# Patient Record
Sex: Female | Born: 1975 | Race: Black or African American | Hispanic: No | Marital: Married | Smoking: Never smoker
Health system: Southern US, Community
[De-identification: ages and names within clinical notes are randomized; demographics above are authoritative.]

## PROBLEM LIST (undated history)

## (undated) DIAGNOSIS — G43909 Migraine, unspecified, not intractable, without status migrainosus: Secondary | ICD-10-CM

## (undated) DIAGNOSIS — M779 Enthesopathy, unspecified: Secondary | ICD-10-CM

## (undated) DIAGNOSIS — R112 Nausea with vomiting, unspecified: Secondary | ICD-10-CM

## (undated) DIAGNOSIS — Z9889 Other specified postprocedural states: Secondary | ICD-10-CM

---

## 1999-02-07 ENCOUNTER — Other Ambulatory Visit: Admission: RE | Admit: 1999-02-07 | Discharge: 1999-02-07 | Payer: Self-pay | Admitting: Obstetrics and Gynecology

## 1999-05-09 ENCOUNTER — Other Ambulatory Visit: Admission: RE | Admit: 1999-05-09 | Discharge: 1999-05-09 | Payer: Self-pay | Admitting: Obstetrics and Gynecology

## 1999-08-13 ENCOUNTER — Inpatient Hospital Stay (HOSPITAL_COMMUNITY): Admission: AD | Admit: 1999-08-13 | Discharge: 1999-08-15 | Payer: Self-pay | Admitting: Obstetrics and Gynecology

## 1999-12-04 ENCOUNTER — Encounter (INDEPENDENT_AMBULATORY_CARE_PROVIDER_SITE_OTHER): Payer: Self-pay

## 1999-12-04 ENCOUNTER — Other Ambulatory Visit: Admission: RE | Admit: 1999-12-04 | Discharge: 1999-12-04 | Payer: Self-pay | Admitting: Obstetrics and Gynecology

## 2003-10-08 ENCOUNTER — Other Ambulatory Visit: Admission: RE | Admit: 2003-10-08 | Discharge: 2003-10-08 | Payer: Self-pay | Admitting: *Deleted

## 2005-01-01 ENCOUNTER — Other Ambulatory Visit: Admission: RE | Admit: 2005-01-01 | Discharge: 2005-01-01 | Payer: Self-pay | Admitting: Obstetrics and Gynecology

## 2006-02-26 ENCOUNTER — Other Ambulatory Visit: Admission: RE | Admit: 2006-02-26 | Discharge: 2006-02-26 | Payer: Self-pay | Admitting: Obstetrics & Gynecology

## 2006-11-13 ENCOUNTER — Inpatient Hospital Stay (HOSPITAL_COMMUNITY): Admission: AD | Admit: 2006-11-13 | Discharge: 2006-11-13 | Payer: Self-pay | Admitting: Obstetrics and Gynecology

## 2007-05-28 ENCOUNTER — Inpatient Hospital Stay (HOSPITAL_COMMUNITY): Admission: AD | Admit: 2007-05-28 | Discharge: 2007-05-28 | Payer: Self-pay | Admitting: Obstetrics and Gynecology

## 2007-05-30 ENCOUNTER — Inpatient Hospital Stay (HOSPITAL_COMMUNITY): Admission: AD | Admit: 2007-05-30 | Discharge: 2007-06-01 | Payer: Self-pay | Admitting: Obstetrics and Gynecology

## 2008-06-28 ENCOUNTER — Emergency Department (HOSPITAL_COMMUNITY): Admission: EM | Admit: 2008-06-28 | Discharge: 2008-06-28 | Payer: Self-pay | Admitting: Emergency Medicine

## 2010-07-01 NOTE — H&P (Signed)
NAME:  Shannon Dean, Shannon Dean                 ACCOUNT NO.:  [id number]   MEDICAL RECORD NO.:  [id number]          PATIENT TYPE:  MAT   LOCATION:  [facility]                          FACILITY:  [facility]   PHYSICIAN:  Hal Morales, M.D.DATE OF BIRTH:  [birth year]   DATE OF ADMISSION:  05/30/2007  DATE OF DISCHARGE:                              HISTORY & PHYSICAL   This is a 35 year old gravida 4, para 1-1-1-2 at 39-4/7 weeks who  presents in labor.  She denies rupture of membranes or bleeding and  reports positive fetal movement.  Pregnancy has been followed by the  nurse midwife service and remarkable for  1. First trimester bleed.  2. Placenta previa, resolved.  3. First trimester BV  4. History of rapid labor,  5. Five abnormal Pap.  6. History of 36-week delivery.  7. Group B strep negative.   ALLERGIES:  None.   OBSTETRICAL HISTORY:  Remarkable for  1. Spontaneous abortion in 1999.  2. Vaginal delivery in 2001 of a female infant at [redacted] weeks gestation      weighing 5 pounds 9 ounces.  3. She had a vaginal delivery in 2004 of a female infant at [redacted] weeks      gestation weighing 7 pounds 4 ounces.   MEDICAL HISTORY:  Remarkable for abnormal Pap in 2001 with colposcopy  and normal since then.   SURGICAL HISTORY:  Negative.   FAMILY HISTORY:  Remarkable for grandfather and grandmother with heart  disease, hypertension and diabetes.  Grandfather with stroke.   GENETIC HISTORY:  Remarkable for father of baby's brother with extra  digits.  The patient's cousin with sickle cell trait and father of the  baby's grandmother with twins.   SOCIAL HISTORY:  The patient is married to [employer], who is involved  and supportive.  She is of the non-denominational Christian faith.  She  denies any alcohol, tobacco or drug use.   PERTINENT LABORATORY DATA:  Hemoglobin 12.4, platelets 172.  Blood type  O+, antibody screen negative, RPR nonreactive.  Hepatitis negative, HIV  negative.  Pap test  normal.   HISTORY OF CURRENT PREGNANCY:  The patient entered care at 12 weeks.  She had an ultrasound that was normal at that time, except for a  placenta previa.  She had an anatomy ultrasound at 18 weeks that showed  placenta now low-lying and a Glucola was done at 27 weeks and was  normal.  Blood type was O+ and Group B strep was negative at term.   OBJECTIVE:  VITAL SIGNS: Stable, afebrile.  HEENT: Within normal limits.  NECK:  Thyroid normal, not enlarged.  CHEST: Clear to auscultation.  HEART:  Regular rate and rhythm.  ABDOMEN:  Gravid, vertex Leopold's. CFM shows reassuring fetal heart  rate with contractions every 2-3 minutes.  PELVIC:  Cervix is 8 cm, 90% effaced, -1 station with bulging membranes  and a vertex presentation.  EXTREMITIES:  Within normal limits.   ASSESSMENT:  1. Intrauterine pregnancy at 39-4/7 weeks.  2. Active labor.   PLAN:  1. Admit per Dr. Pennie Rushing.  2.  Routine CNM orders.  3. Anticipate SVD.      Marie L. Williams, C.N.M.      Hal Morales, M.D.  Electronically Signed    MLW/MEDQ  D:  05/30/2007  T:  05/30/2007  Job:  [id number]

## 2010-11-11 LAB — CBC
HCT: 35.4 — ABNORMAL LOW
HCT: 35.5 — ABNORMAL LOW
Hemoglobin: 12.1
MCHC: 34.1
MCHC: 34.2
MCV: 92.8
MCV: 93.5
Platelets: 114 — ABNORMAL LOW
RBC: 3.81 — ABNORMAL LOW
RDW: 14.2

## 2010-11-11 LAB — RPR: RPR Ser Ql: NONREACTIVE

## 2011-10-10 ENCOUNTER — Emergency Department (HOSPITAL_COMMUNITY)
Admission: EM | Admit: 2011-10-10 | Discharge: 2011-10-10 | Disposition: A | Discharge status: Home / Self Care | Payer: Self-pay | Attending: Emergency Medicine | Admitting: Emergency Medicine

## 2011-10-10 ENCOUNTER — Encounter (HOSPITAL_COMMUNITY): Payer: Self-pay | Admitting: *Deleted

## 2011-10-10 DIAGNOSIS — L509 Urticaria, unspecified: Secondary | ICD-10-CM | POA: Insufficient documentation

## 2011-10-10 MED ORDER — FAMOTIDINE 20 MG PO TABS
20.0000 mg | ORAL_TABLET | Freq: Once | ORAL | Status: AC
  Administered 2011-10-10: 20 mg via ORAL
  Filled 2011-10-10: qty 1

## 2011-10-10 MED ORDER — FAMOTIDINE 20 MG PO TABS: 20.0000 mg | ORAL_TABLET | Freq: Two times a day (BID) | ORAL | Status: DC

## 2011-10-10 MED ORDER — PREDNISONE 10 MG PO TABS: 40.0000 mg | ORAL_TABLET | Freq: Every day | ORAL | Status: DC

## 2011-10-10 MED ORDER — PREDNISONE 20 MG PO TABS
60.0000 mg | ORAL_TABLET | Freq: Once | ORAL | Status: AC
  Administered 2011-10-10: 60 mg via ORAL
  Filled 2011-10-10: qty 3

## 2011-10-10 NOTE — ED Notes (Signed)
Pt denies SOB & difficulty breathing

## 2011-10-10 NOTE — ED Notes (Signed)
Pt noticed a pruritic rash on her chest at 1600, flat/macular.  Pt denies sob, known allergies.  Rash has now spread to arms.  NO relief with gold bond cream.

## 2011-10-10 NOTE — ED Provider Notes (Signed)
History     CSN: [id number]  Arrival date & time 10/10/11  2055   First MD Initiated Contact with Patient 10/10/11 2222      Chief Complaint  Patient presents with  . Rash    (Consider location/radiation/quality/duration/timing/severity/associated sxs/prior treatment) HPI Comments: Patient noticed a periodic red, non-raised rash on her anterior chest.  This, afternoon.  She tried some topical cold, Baughn, ointment at that time.  She noticed she had the rash on her shoulders and upper arms, as well.  She denies any use of new cosmetics body products clothing, new foods.  She has never had urticaria before.  He denies shortness of breath, chest pain, abdominal pain, nausea, vomiting, dizziness  Patient is a 36 y.o. female presenting with rash. The history is provided by the spouse.  Rash  This is a new problem. The current episode started 6 to 12 hours ago. The problem has been gradually worsening. The problem is associated with nothing. There has been no fever. The patient is experiencing no pain.    No past medical history on file.  No past surgical history on file.  No family history on file.  History  Substance Use Topics  . Smoking status: Never Smoker   . Smokeless tobacco: Not on file  . Alcohol Use: No    OB History    Grav Para Term Preterm Abortions TAB SAB Ect Mult Living                  Review of Systems  Constitutional: Negative for fever and chills.  HENT: Negative for congestion, rhinorrhea and trouble swallowing.   Gastrointestinal: Negative for nausea and abdominal pain.  Skin: Positive for rash.  Neurological: Negative for dizziness.    Allergies  Review of patient's allergies indicates no known allergies.  Home Medications   Current Outpatient Rx  Name Route Sig Dispense Refill  . FAMOTIDINE 20 MG PO TABS Oral Take 1 tablet (20 mg total) by mouth 2 (two) times daily. 10 tablet 0  . PREDNISONE 10 MG PO TABS Oral Take 4 tablets (40 mg total)  by mouth daily. 10 tablet 0    BP 115/75  Pulse 77  Temp 98.3 F (36.8 C) (Oral)  Resp 16  SpO2 96%  Physical Exam  Constitutional: She is oriented to person, place, and time.  Eyes: Pupils are equal, round, and reactive to light.  Cardiovascular: Normal rate.   Pulmonary/Chest: Effort normal. No respiratory distress.  Musculoskeletal: Normal range of motion.  Neurological: She is alert and oriented to person, place, and time.  Skin: Rash noted.    ED Course  Procedures (including critical care time)  Labs Reviewed - No data to display No results found.   1. Urticaria       MDM   Patient has uric, area, we'll treat with Pepcid and steroids have instructed the patient on signs and symptoms to return in including tachycardia, abdominal pain, nausea, vomiting, dizziness, difficulty swallowing, shortness of breath        Arman Filter, NP 10/10/11 2300  Arman Filter, NP 10/10/11 2300

## 2011-10-11 NOTE — ED Provider Notes (Signed)
Medical screening examination/treatment/procedure(s) were performed by non-physician practitioner and as supervising physician I was immediately available for consultation/collaboration.  Flint Melter, MD 10/11/11 [phone]

## 2011-12-20 ENCOUNTER — Emergency Department (HOSPITAL_COMMUNITY)
Admission: EM | Admit: 2011-12-20 | Discharge: 2011-12-20 | Disposition: A | Discharge status: Home / Self Care | Payer: Self-pay | Attending: Emergency Medicine | Admitting: Emergency Medicine

## 2011-12-20 ENCOUNTER — Encounter (HOSPITAL_COMMUNITY): Payer: Self-pay | Admitting: Emergency Medicine

## 2011-12-20 DIAGNOSIS — Z79899 Other long term (current) drug therapy: Secondary | ICD-10-CM | POA: Insufficient documentation

## 2011-12-20 DIAGNOSIS — G43909 Migraine, unspecified, not intractable, without status migrainosus: Secondary | ICD-10-CM | POA: Insufficient documentation

## 2011-12-20 DIAGNOSIS — R55 Syncope and collapse: Secondary | ICD-10-CM | POA: Insufficient documentation

## 2011-12-20 HISTORY — DX: Migraine, unspecified, not intractable, without status migrainosus: G43.909

## 2011-12-20 LAB — URINALYSIS, ROUTINE W REFLEX MICROSCOPIC
Bilirubin Urine: NEGATIVE
Ketones, ur: NEGATIVE mg/dL
Nitrite: NEGATIVE
Specific Gravity, Urine: 1.023 (ref 1.005–1.030)
Urobilinogen, UA: 1 mg/dL (ref 0.0–1.0)
pH: 6.5 (ref 5.0–8.0)

## 2011-12-20 LAB — URINE MICROSCOPIC-ADD ON

## 2011-12-20 LAB — CBC WITH DIFFERENTIAL/PLATELET
Basophils Absolute: 0 10*3/uL (ref 0.0–0.1)
Basophils Relative: 0 % (ref 0–1)
Eosinophils Absolute: 0.1 10*3/uL (ref 0.0–0.7)
MCH: 29.6 pg (ref 26.0–34.0)
MCHC: 33.4 g/dL (ref 30.0–36.0)
Neutro Abs: 2 10*3/uL (ref 1.7–7.7)
Neutrophils Relative %: 48 % (ref 43–77)
Platelets: 181 10*3/uL (ref 150–400)
RDW: 12.7 % (ref 11.5–15.5)

## 2011-12-20 LAB — BASIC METABOLIC PANEL
Chloride: 106 mEq/L (ref 96–112)
GFR calc Af Amer: >90 mL/min (ref >90)
GFR calc non Af Amer: >90 mL/min (ref >90)
Potassium: 3.6 mEq/L (ref 3.5–5.1)

## 2011-12-20 MED ORDER — DEXAMETHASONE SODIUM PHOSPHATE 4 MG/ML IJ SOLN
10.0000 mg | Freq: Once | INTRAMUSCULAR | Status: AC
  Filled 2011-12-20: qty 1

## 2011-12-20 MED ORDER — METOCLOPRAMIDE HCL 5 MG/ML IJ SOLN
10.0000 mg | Freq: Once | INTRAMUSCULAR | Status: AC
  Administered 2011-12-20: 10 mg via INTRAVENOUS
  Filled 2011-12-20: qty 2

## 2011-12-20 MED ORDER — DIPHENHYDRAMINE HCL 50 MG/ML IJ SOLN
25.0000 mg | Freq: Once | INTRAMUSCULAR | Status: AC
  Administered 2011-12-20: 25 mg via INTRAVENOUS
  Filled 2011-12-20: qty 1

## 2011-12-20 MED ORDER — DEXAMETHASONE SODIUM PHOSPHATE 10 MG/ML IJ SOLN
INTRAMUSCULAR | Status: AC
  Administered 2011-12-20: 10 mg
  Filled 2011-12-20: qty 1

## 2011-12-20 MED ORDER — IBUPROFEN 800 MG PO TABS: 800.0000 mg | ORAL_TABLET | Freq: Three times a day (TID) | ORAL | Status: DC

## 2011-12-20 MED ORDER — SODIUM CHLORIDE 0.9 % IV BOLUS (SEPSIS)
1000.0000 mL | Freq: Once | INTRAVENOUS | Status: AC
  Administered 2011-12-20: 1000 mL via INTRAVENOUS

## 2011-12-20 NOTE — ED Notes (Signed)
Pt woke up weak this am, went to church, upon leaving church she fainted, was caught in arms by a nurse who stated she lost consciousness for 10 mins. When EMS arrived she was alert. Pt is currently A&Ox4, states she has a 9/10 headache "my whole head hurts", weak, dizzy.

## 2011-12-20 NOTE — ED Provider Notes (Signed)
History     CSN: [id number]  Arrival date & time 12/20/11  1253   None     Chief Complaint  Patient presents with  . Loss of Consciousness    (Consider location/radiation/quality/duration/timing/severity/associated sxs/prior treatment) HPI Comments: Patient has history significant for migraine headaches. She's had a headache since yesterday morning, it was gradual onset, this is not the worst of her life. Has had migraines similar to this in the past, she has also had syncopal episodes in the past associated with her migraines. This morning she went to church and soon after arriving her headache got worse and so she decided to leave. When she got up to leave she blacked out and woke up on the ground with people standing over her. Denies any chest pain, shortness of breath, palpitations, seizure-like activity, postictal phase, recent fever or chills, abdominal pain, or other neurological symptoms other than the headache. She says her headache maximized at a 10 out of 10 but is currently a 7/10. Denies any focal weakness, paresthesias, vision change. Positive for photophobia. Has not tried any medicines at home. No bowel or urinary incontinence  Patient is a 36 y.o. female presenting with migraines. The history is provided by the patient. No language interpreter was used.  Migraine This is a recurrent problem. The current episode started yesterday. The problem occurs constantly. The problem has been gradually improving. Associated symptoms include headaches and weakness (generalized). Pertinent negatives include no abdominal pain, arthralgias, chest pain, chills, congestion, coughing, fatigue, fever, nausea, neck pain, numbness, sore throat, vertigo, visual change or vomiting. Exacerbated by: bright light. She has tried nothing for the symptoms. The treatment provided no relief.    Past Medical History  Diagnosis Date  . Syncope   . Migraines     History reviewed. No pertinent past surgical  history.  History reviewed. No pertinent family history.  History  Substance Use Topics  . Smoking status: Never Smoker   . Smokeless tobacco: Not on file  . Alcohol Use: No    OB History    Grav Para Term Preterm Abortions TAB SAB Ect Mult Living                  Review of Systems  Constitutional: Negative for fever, chills, activity change, appetite change and fatigue.  HENT: Negative for congestion, sore throat, rhinorrhea, neck pain, neck stiffness and sinus pressure.   Eyes: Positive for photophobia. Negative for discharge and visual disturbance.  Respiratory: Negative for cough, chest tightness, shortness of breath, wheezing and stridor.   Cardiovascular: Negative for chest pain and leg swelling.  Gastrointestinal: Negative for nausea, vomiting, abdominal pain, diarrhea and abdominal distention.  Genitourinary: Positive for vaginal bleeding (light, started period today). Negative for decreased urine volume and difficulty urinating.  Musculoskeletal: Negative for back pain and arthralgias.  Skin: Negative for color change and pallor.  Neurological: Positive for syncope (out for approx per pt. she states this is what those who witnessed it told her.), weakness (generalized) and headaches. Negative for vertigo, light-headedness and numbness.  Psychiatric/Behavioral: Negative for behavioral problems and agitation.  All other systems reviewed and are negative.    Allergies  Review of patient's allergies indicates no known allergies.  Home Medications   Current Outpatient Rx  Name  Route  Sig  Dispense  Refill  . IBUPROFEN 200 MG PO TABS   Oral   Take 200 mg by mouth every 6 (six) hours as needed. For pain         .  LEVONORGESTREL 20 MCG/24HR IU IUD   Intrauterine   1 each by Intrauterine route once.           BP 102/66  Pulse 60  Temp 99 F (37.2 C) (Oral)  Resp 0  SpO2 100%  Physical Exam  Nursing note and vitals reviewed. Constitutional: She is  oriented to person, place, and time. She appears well-developed and well-nourished. No distress.  HENT:  Head: Normocephalic and atraumatic.  Mouth/Throat: No oropharyngeal exudate.  Eyes: EOM are normal. Pupils are equal, round, and reactive to light. Right eye exhibits no discharge. Left eye exhibits no discharge.  Neck: Normal range of motion. Neck supple. No JVD present.  Cardiovascular: Normal rate, regular rhythm and normal heart sounds.   Pulmonary/Chest: Effort normal and breath sounds normal. No stridor. No respiratory distress. She exhibits no tenderness.  Abdominal: Soft. Bowel sounds are normal. She exhibits no distension. There is no tenderness. There is no guarding.  Musculoskeletal: Normal range of motion. She exhibits no edema and no tenderness.  Neurological: She is alert and oriented to person, place, and time. She has normal reflexes. She displays normal reflexes. No cranial nerve deficit. She exhibits normal muscle tone. Coordination (nml f to n) normal.       +photophobia. Fundoscopic exam limited 2/2 photophobia and pupil consstriction.  Skin: Skin is warm and dry. No rash noted. She is not diaphoretic.  Psychiatric: She has a normal mood and affect. Her behavior is normal. Judgment and thought content normal.    ED Course  Procedures (including critical care time)  Labs Reviewed  URINALYSIS, ROUTINE W REFLEX MICROSCOPIC - Abnormal; Notable for the following:    APPearance CLOUDY (*)     Hgb urine dipstick LARGE (*)     Leukocytes, UA TRACE (*)     All other components within normal limits  URINE MICROSCOPIC-ADD ON - Abnormal; Notable for the following:    Squamous Epithelial / LPF FEW (*)     Bacteria, UA MANY (*)     All other components within normal limits  CBC WITH DIFFERENTIAL  BASIC METABOLIC PANEL  POCT PREGNANCY, URINE   No results found.   1. Migraine       Date: 12/20/2011  Rate: 58  Rhythm: normal sinus rhythm  QRS Axis: normal  Intervals:  normal  ST/T Wave abnormalities: normal  Conduction Disutrbances: none  Narrative Interpretation: neg for long QT, brugada, or WPW  Old EKG Reviewed: none    MDM  3:10 PM no red flags for her syncope. Negative based on [address], [place], and [place] syncope criteria. Doubt arrythmia, ACS, CVA, seizure, hypoglycemia. Will treat as migraine since this is similar to prior and she has passed out from it before. I have considered but doubt the possible etiologies of the patient's headache including subarachnoid hemorrhage, increased intracranial pressure, epidural or subdural hematoma, CVA, dural sinus venous thrombosis, meningitis or encephalitis.   4:17 PM HA resolved, back to baseline. Likely migraine. Pt deemed stable for discharge. Return precautions were provided and pt expressed understanding to return to ED if any acute symptoms return. Follow up was instructed which pt also expressed understanding. All questions were answered and pt was in agreement w/ plan.      Warrick Parisian, MD 12/20/11 [phone]

## 2011-12-20 NOTE — ED Notes (Signed)
Per EMS, pt was in church when she fainted, caught by nurse, out for 10 mins, headache x2 days. Pt was responsive when EMS arrived, c/o frontal headache and nausea, BS 84, BP 108/82, HR 66, NSR. 20G PIV Left hand. Past hx of migraines and syncope.

## 2011-12-30 NOTE — ED Provider Notes (Signed)
I saw and evaluated the patient, reviewed the resident's note and I agree with the findings and plan.  Please see my completed note for this encounter.  Raeford Razor, MD 12/30/11 0120

## 2011-12-30 NOTE — ED Provider Notes (Signed)
I saw and evaluated the patient, reviewed the resident's note and I agree with the findings and plan.  36 year old female with syncope. EKG with normal intervals and no ischemic changes. Heart is fairly unremarkable. No acute distress on exam. Lungs clear. Heart regular without significant ectopy. No murmur appreciated. Nonfocal neurological examination.Lower extremities symmetric as compared to each other. No calf tenderness. Negative Homan's. No palpable cords. No concerning "red flags" Shannon Dean patient is safe for discharge at this time. Return precautions were discussed.  Raeford Razor, MD 12/30/11 0120

## 2012-07-28 ENCOUNTER — Other Ambulatory Visit (HOSPITAL_COMMUNITY): Payer: Self-pay | Admitting: Obstetrics and Gynecology

## 2012-07-28 DIAGNOSIS — Z09 Encounter for follow-up examination after completed treatment for conditions other than malignant neoplasm: Secondary | ICD-10-CM

## 2012-07-28 DIAGNOSIS — Z9851 Tubal ligation status: Secondary | ICD-10-CM

## 2012-08-17 ENCOUNTER — Ambulatory Visit (HOSPITAL_COMMUNITY)
Admission: RE | Admit: 2012-08-17 | Discharge: 2012-08-17 | Disposition: A | Discharge status: Home / Self Care | Payer: BC Managed Care – PPO | Source: Ambulatory Visit | Attending: Obstetrics and Gynecology | Admitting: Obstetrics and Gynecology

## 2012-08-17 DIAGNOSIS — Z9851 Tubal ligation status: Secondary | ICD-10-CM

## 2012-08-17 DIAGNOSIS — Z09 Encounter for follow-up examination after completed treatment for conditions other than malignant neoplasm: Secondary | ICD-10-CM

## 2012-08-17 DIAGNOSIS — Z3049 Encounter for surveillance of other contraceptives: Secondary | ICD-10-CM | POA: Insufficient documentation

## 2012-08-17 MED ORDER — IOHEXOL 300 MG/ML  SOLN
20.0000 mL | Freq: Once | INTRAMUSCULAR | Status: AC | PRN
  Administered 2012-08-17: 8 mL

## 2013-10-05 ENCOUNTER — Encounter (HOSPITAL_COMMUNITY): Payer: Self-pay | Admitting: Emergency Medicine

## 2013-10-05 ENCOUNTER — Emergency Department (HOSPITAL_COMMUNITY)
Admission: EM | Admit: 2013-10-05 | Discharge: 2013-10-05 | Disposition: A | Discharge status: Home / Self Care | Payer: BC Managed Care – PPO | Source: Home / Self Care | Attending: Family Medicine | Admitting: Family Medicine

## 2013-10-05 DIAGNOSIS — H6120 Impacted cerumen, unspecified ear: Secondary | ICD-10-CM

## 2013-10-05 DIAGNOSIS — H6122 Impacted cerumen, left ear: Secondary | ICD-10-CM

## 2013-10-05 DIAGNOSIS — H60399 Other infective otitis externa, unspecified ear: Secondary | ICD-10-CM

## 2013-10-05 DIAGNOSIS — H6092 Unspecified otitis externa, left ear: Secondary | ICD-10-CM

## 2013-10-05 MED ORDER — ANTIPYRINE-BENZOCAINE 5.4-1.4 % OT SOLN: 3.0000 [drp] | Freq: Four times a day (QID) | OTIC | Status: DC | PRN

## 2013-10-05 MED ORDER — CIPROFLOXACIN-DEXAMETHASONE 0.3-0.1 % OT SUSP: 4.0000 [drp] | Freq: Two times a day (BID) | OTIC | Status: DC

## 2013-10-05 NOTE — ED Notes (Signed)
C/o problems w left ear x 1 week. Cerumen impaction noted in left ear. States she has had problems since last child born (9 yrs), felt something pop and hearing has been diminished since then

## 2013-10-05 NOTE — ED Provider Notes (Signed)
CSN: [id number]     Arrival date & time 10/05/13  1829 History   First MD Initiated Contact with Patient 10/05/13 [phone]     Chief Complaint  Patient presents with  . Ear Problem   (Consider location/radiation/quality/duration/timing/severity/associated sxs/prior Treatment) HPI  L ear stopped up: started last week. Occurs off and on over the years. Uses qtips w/o relief. Not sure of anything that makes it worse. Occasionally hurts. Achy pain. Last night woke pt up in the middle of the night. Associated w/ decreased hearing. Denies fevers, rash, syncope, dizziness, lightheadedness, CP, SOB.   Past Medical History  Diagnosis Date  . Syncope   . Migraines    History reviewed. No pertinent past surgical history. Family History  Problem Relation Age of Onset  . Hypertension Mother    History  Substance Use Topics  . Smoking status: Never Smoker   . Smokeless tobacco: Not on file  . Alcohol Use: No   OB History   Grav Para Term Preterm Abortions TAB SAB Ect Mult Living                 Review of Systems Per HPI with all other pertinent systems negative.    Allergies  Review of patient's allergies indicates no known allergies.  Home Medications   Prior to Admission medications   Medication Sig Start Date End Date Taking? Authorizing Provider  antipyrine-benzocaine Toniann Fail) otic solution Place 3-4 drops into both ears 4 (four) times daily as needed for ear pain. 10/05/13   Waldemar Dickens, MD  ciprofloxacin-dexamethasone [facility]) otic suspension Place 4 drops into the left ear 2 (two) times daily. Treat for 5 days 10/05/13   Waldemar Dickens, MD  ibuprofen (ADVIL,MOTRIN) 200 MG tablet Take 200 mg by mouth every 6 (six) hours as needed. For pain    Historical Provider, MD  ibuprofen (ADVIL,MOTRIN) 800 MG tablet Take 1 tablet (800 mg total) by mouth 3 (three) times daily. 12/20/11   Verdie Shire, MD  levonorgestrel (MIRENA) 20 MCG/24HR IUD 1 each by Intrauterine route once.     Historical Provider, MD   BP 123/75  Pulse 66  Temp(Src) 98.5 F (36.9 C) (Oral)  Resp 14  SpO2 100% Physical Exam  Constitutional: She appears well-developed and well-nourished. No distress.  HENT:  Head: Normocephalic and atraumatic.  L external canal injected w/ various areas of skin maceration w/ selling and mild white discharge TM nml bilat  Eyes: EOM are normal. Pupils are equal, round, and reactive to light.  Neck: Normal range of motion. Neck supple. No tracheal deviation present.  Cardiovascular: Normal rate, normal heart sounds and intact distal pulses.   No murmur heard. Pulmonary/Chest: Effort normal. No respiratory distress.  Abdominal: Soft. She exhibits no distension.  Musculoskeletal: Normal range of motion. She exhibits no edema and no tenderness.  Lymphadenopathy:    She has no cervical adenopathy.  Neurological: No cranial nerve deficit. She exhibits normal muscle tone. Coordination normal.  Skin: Skin is warm. No rash noted. She is not diaphoretic.  Psychiatric: She has a normal mood and affect. Her behavior is normal. Judgment normal.    ED Course  Procedures (including critical care time) Labs Review Labs Reviewed - No data to display  Imaging Review No results found.   MDM   1. Cerumen impaction, left   2. Otitis externa, left    Ear clean out in clinic w/ large cerumen plug flushed from canal Start ciprodex Auralgan prn pain Discussed regular H2O2/water  soaks for ears and stop using Qtips. Precautions given and all questions answered   Linna Darner, MD Family Medicine 10/05/2013, 10:46 AM      Waldemar Dickens, MD 10/05/13 1046

## 2013-10-05 NOTE — Discharge Instructions (Signed)
Your left ear was completely blocked by wax This was removed in our clinic You have an underlying external ear infection. This will be best treated with antibiotics and pain drops Please use them as prescribed Please consider using hydrogenperoxide/water mixture once a week in the future to help break up the wax.

## 2015-01-18 ENCOUNTER — Ambulatory Visit (INDEPENDENT_AMBULATORY_CARE_PROVIDER_SITE_OTHER): Payer: BLUE CROSS/BLUE SHIELD | Admitting: Physician Assistant

## 2015-01-18 VITALS — BP 110/80 | HR 70 | Temp 97.7°F | Resp 16 | Ht 60.0 in | Wt 157.4 lb

## 2015-01-18 DIAGNOSIS — R319 Hematuria, unspecified: Secondary | ICD-10-CM | POA: Diagnosis not present

## 2015-01-18 DIAGNOSIS — R109 Unspecified abdominal pain: Secondary | ICD-10-CM

## 2015-01-18 DIAGNOSIS — R82998 Other abnormal findings in urine: Secondary | ICD-10-CM

## 2015-01-18 DIAGNOSIS — N390 Urinary tract infection, site not specified: Secondary | ICD-10-CM

## 2015-01-18 DIAGNOSIS — N912 Amenorrhea, unspecified: Secondary | ICD-10-CM | POA: Diagnosis not present

## 2015-01-18 LAB — POCT URINALYSIS DIP (MANUAL ENTRY)
Bilirubin, UA: NEGATIVE
Glucose, UA: NEGATIVE
Ketones, POC UA: NEGATIVE
Nitrite, UA: NEGATIVE
PH UA: 6
Spec Grav, UA: 1.02
UROBILINOGEN UA: 0.2

## 2015-01-18 LAB — POC MICROSCOPIC URINALYSIS (UMFC): MUCUS RE: ABSENT

## 2015-01-18 LAB — POCT URINE PREGNANCY: PREG TEST UR: NEGATIVE

## 2015-01-18 MED ORDER — TAMSULOSIN HCL 0.4 MG PO CAPS: 0.4000 mg | ORAL_CAPSULE | Freq: Every day | ORAL | Status: DC

## 2015-01-18 NOTE — Progress Notes (Signed)
AIA HEDGES  MRN: [id number] DOB: 05/30/1975  Subjective:  Pt presents to clinic with left flank pain that started about 1.5 weeks ago.  It is not getting better but it is not getting worse.  The pain does not radiate and does not change with urination or movement.  She has had a kidney infection in the past and this feels similar to that but she is not having any urinary symptoms currently. Her menses is late so she wonders if that is why she is having back pain.  She is having no F/C.  She has had no change in her bowel habits.  She eating has not been affected. She has never had a kidney stone.  Tried in the increase water intake but she has not noticed a difference since she did that.  There are no active problems to display for this patient.   No current outpatient prescriptions on file prior to visit.   No current facility-administered medications on file prior to visit.    No Known Allergies  Review of Systems  Constitutional: Negative for fever and chills.  Gastrointestinal: Negative for nausea, vomiting, abdominal pain, diarrhea and constipation.  Genitourinary: Positive for flank pain (left side) and pelvic pain (menstrual cramping). Negative for dysuria, urgency and frequency.   Objective:  BP 110/80 mmHg  Pulse 70  Temp(Src) 97.7 F (36.5 C) (Oral)  Resp 16  Ht 5' (1.524 m)  Wt 157 lb 6.4 oz (71.396 kg)  BMI 30.74 kg/m2  SpO2 97%  LMP 11/30/2014 (Approximate)  Physical Exam  Constitutional: She is oriented to person, place, and time and well-developed, well-nourished, and in no distress.  HENT:  Head: Normocephalic and atraumatic.  Right Ear: Hearing and external ear normal.  Left Ear: Hearing and external ear normal.  Eyes: Conjunctivae are normal.  Neck: Normal range of motion.  Cardiovascular: Normal rate, regular rhythm and normal heart sounds.   No murmur heard. Pulmonary/Chest: Effort normal and breath sounds normal.  Abdominal: Soft. Tenderness:  generalized lower abd pain. There is no rebound, no guarding and no CVA tenderness.  Musculoskeletal:       Lumbar back: She exhibits normal range of motion, no tenderness and no spasm.  Neurological: She is alert and oriented to person, place, and time. She has normal sensation, normal strength and normal reflexes. She displays no weakness. She has a normal Straight Leg Raise Test. Gait normal. Gait normal.  Skin: Skin is warm and dry.  Psychiatric: Mood, memory, affect and judgment normal.  Vitals reviewed.   Results for orders placed or performed in visit on 01/18/15  POCT urine pregnancy  Result Value Ref Range   Preg Test, Ur Negative Negative  POCT urinalysis dipstick  Result Value Ref Range   Color, UA yellow yellow   Clarity, UA cloudy (A) clear   Glucose, UA negative negative   Bilirubin, UA negative negative   Ketones, POC UA negative negative   Spec Grav, UA 1.020    Blood, UA large (A) negative   pH, UA 6.0    Protein Ur, POC trace (A) negative   Urobilinogen, UA 0.2    Nitrite, UA Negative Negative   Leukocytes, UA Trace (A) Negative  POCT Microscopic Urinalysis ([facility])  Result Value Ref Range   WBC,UR,HPF,POC None None WBC/hpf   RBC,UR,HPF,POC Too numerous to count  (A) None RBC/hpf   Bacteria Few (A) None, Too numerous to count   Mucus Absent Absent   Epithelial Cells, UR Per  Microscopy Few (A) None, Too numerous to count cells/hpf    Assessment and Plan :  Left flank pain - Plan: POCT urinalysis dipstick, POCT Microscopic Urinalysis ([facility]), tamsulosin (FLOMAX) 0.4 MG CAPS capsule  Amenorrhea - Plan: POCT urine pregnancy  Hematuria  Leukocytes in urine - Plan: Urine culture   Unsure of exact etiology at this time - it is possible that the patient will be starting her menses soon, she will be mindful of increasing her fluids while waiting for her urine culture.  If she does not start her menses and the pain continues we will have to do a scan due to her  hematuria.  Her questions were answered and she understands and agrees with the plan.  Windell Hummingbird PA-C  [facility] 01/18/2015 2:58 PM

## 2015-01-18 NOTE — Patient Instructions (Addendum)
Push fluids Let me know if you do not start your menses and your pain continues we may need to do a scan to look for kidney stone

## 2015-12-05 ENCOUNTER — Emergency Department (HOSPITAL_BASED_OUTPATIENT_CLINIC_OR_DEPARTMENT_OTHER)
Admission: EM | Admit: 2015-12-05 | Discharge: 2015-12-06 | Disposition: A | Discharge status: Home / Self Care | Payer: BLUE CROSS/BLUE SHIELD | Attending: Emergency Medicine | Admitting: Emergency Medicine

## 2015-12-05 ENCOUNTER — Encounter (HOSPITAL_BASED_OUTPATIENT_CLINIC_OR_DEPARTMENT_OTHER): Payer: Self-pay | Admitting: *Deleted

## 2015-12-05 DIAGNOSIS — Y929 Unspecified place or not applicable: Secondary | ICD-10-CM | POA: Diagnosis not present

## 2015-12-05 DIAGNOSIS — Y999 Unspecified external cause status: Secondary | ICD-10-CM | POA: Diagnosis not present

## 2015-12-05 DIAGNOSIS — Y9389 Activity, other specified: Secondary | ICD-10-CM | POA: Insufficient documentation

## 2015-12-05 DIAGNOSIS — W268XXA Contact with other sharp object(s), not elsewhere classified, initial encounter: Secondary | ICD-10-CM | POA: Diagnosis not present

## 2015-12-05 DIAGNOSIS — S6992XA Unspecified injury of left wrist, hand and finger(s), initial encounter: Secondary | ICD-10-CM | POA: Diagnosis present

## 2015-12-05 DIAGNOSIS — Z23 Encounter for immunization: Secondary | ICD-10-CM | POA: Diagnosis not present

## 2015-12-05 DIAGNOSIS — S61217A Laceration without foreign body of left little finger without damage to nail, initial encounter: Secondary | ICD-10-CM | POA: Insufficient documentation

## 2015-12-05 NOTE — ED Triage Notes (Signed)
Laceration to her left 5th digit. She is unable to move her finger.

## 2015-12-06 LAB — PREGNANCY, URINE: PREG TEST UR: NEGATIVE

## 2015-12-06 MED ORDER — TETANUS-DIPHTH-ACELL PERTUSSIS 5-2.5-18.5 LF-MCG/0.5 IM SUSP
0.5000 mL | Freq: Once | INTRAMUSCULAR | Status: AC
  Administered 2015-12-06: 0.5 mL via INTRAMUSCULAR
  Filled 2015-12-06: qty 0.5

## 2015-12-06 MED ORDER — DOXYCYCLINE HYCLATE 100 MG PO CAPS: 100.0000 mg | ORAL_CAPSULE | Freq: Two times a day (BID) | ORAL | 0 refills | Status: DC

## 2015-12-06 MED ORDER — LIDOCAINE HCL (PF) 1 % IJ SOLN
2.0000 mL | Freq: Once | INTRAMUSCULAR | Status: AC
  Administered 2015-12-06: 2 mL
  Filled 2015-12-06: qty 5

## 2015-12-06 NOTE — Discharge Instructions (Signed)
Please take the antibiotics as prescribed. Follow up with the ortho surgeon today. You need to call the office. Keep splint on finger. You may use tylenol and ibuprofen for pain. Keep finger elevated.

## 2015-12-06 NOTE — ED Provider Notes (Signed)
[place] [facility] Provider Note   CSN: [id number] Arrival date & time: 12/05/15  2136     History   Chief Complaint Chief Complaint  Patient presents with  . Laceration    HPI Shannon Dean is a 40 y.o. female.  40 year old African-American female no significant past medical history presents to the ED today after cutting her left pinky finger PTA. Patient was cutting chicken this evening when she sliced her finger.Bleeding is controlled. Bandage was applied. Patient states she is unable to flex her left pinky finger. Moving makes the pain worse. Nothing makes the pain better. She has tried nothing for the pain. Patient can not remember when her last tdap was.       Past Medical History:  Diagnosis Date  . Migraines   . Syncope     There are no active problems to display for this patient.   History reviewed. No pertinent surgical history.  OB History    No data available       Home Medications    Prior to Admission medications   Medication Sig Start Date End Date Taking? Authorizing Provider  doxycycline (VIBRAMYCIN) 100 MG capsule Take 1 capsule (100 mg total) by mouth 2 (two) times daily. 12/06/15   Doristine Devoid, PA-C  tamsulosin (FLOMAX) 0.4 MG CAPS capsule Take 1 capsule (0.4 mg total) by mouth daily. 01/18/15   Mancel Bale, PA-C    Family History Family History  Problem Relation Age of Onset  . Hypertension Mother     Social History Social History  Substance Use Topics  . Smoking status: Never Smoker  . Smokeless tobacco: Never Used  . Alcohol use No     Allergies   Review of patient's allergies indicates no known allergies.   Review of Systems Review of Systems  Constitutional: Negative for chills and fever.  HENT: Negative for ear pain and sore throat.   Eyes: Negative for pain and visual disturbance.  Respiratory: Negative for cough and shortness of breath.   Cardiovascular: Negative for chest pain and palpitations.    Gastrointestinal: Negative for abdominal pain and vomiting.  Genitourinary: Negative for dysuria and hematuria.  Musculoskeletal: Negative for arthralgias and back pain.  Skin: Positive for wound. Negative for color change and rash.  Neurological: Negative for dizziness and syncope.  All other systems reviewed and are negative.    Physical Exam Updated Vital Signs BP 120/58 (BP Location: Right Arm)   Pulse 63   Temp 98 F (36.7 C) (Oral)   Resp 18   Ht 5' (1.524 m)   Wt 64.9 kg   LMP 11/14/2015   SpO2 100%   BMI 27.93 kg/m   Physical Exam  Constitutional: She appears well-developed and well-nourished. No distress.  Eyes: Right eye exhibits no discharge. Left eye exhibits no discharge. No scleral icterus.  Cardiovascular:  Pulses:      Radial pulses are 2+ on the right side, and 2+ on the left side.  Pulmonary/Chest: No respiratory distress.  Musculoskeletal: Normal range of motion.  Patient is unable to flex left pinky finger. She can extend the finger. Good ROM of wrist and all other phalanges.  Neurological: She is alert.  Skin: Capillary refill takes less than 2 seconds. No pallor.  1 cm laceration to the base of the left pinky finger. Bleeding is controlled. No foreign body appreciated.  Sensation is intact. Cap refill normal. Radial pulses are 2+.   Nursing note and vitals reviewed.  ED Treatments / Results  Labs (all labs ordered are listed, but only abnormal results are displayed) Labs Reviewed  PREGNANCY, URINE    EKG  EKG Interpretation None       Radiology No results found.  Procedures .Marland KitchenLaceration Repair Date/Time: 12/06/2015 1:53 AM Performed by: Doristine Devoid Authorized by: Ocie Cornfield T   Consent:    Consent obtained:  Verbal   Consent given by:  Patient   Risks discussed:  Infection, pain, retained foreign body, need for additional repair, nerve damage, vascular damage, poor wound healing and tendon damage   Alternatives  discussed:  No treatment Anesthesia (see MAR for exact dosages):    Anesthesia method:  Local infiltration   Local anesthetic:  Lidocaine 1% w/o epi Laceration details:    Location:  Finger   Finger location:  R small finger   Length (cm):  1   Depth (mm):  1 Repair type:    Repair type:  Simple Pre-procedure details:    Preparation:  Patient was prepped and draped in usual sterile fashion and imaging obtained to evaluate for foreign bodies Exploration:    Hemostasis achieved with:  Direct pressure   Wound exploration: wound explored through full range of motion and entire depth of wound probed and visualized     Wound extent: tendon damage     Wound extent: no foreign bodies/material noted and no vascular damage noted     Tendon repair plan:  Refer for evaluation   Contaminated: no   Treatment:    Area cleansed with:  Betadine and saline   Amount of cleaning:  Extensive   Irrigation solution:  Sterile saline   Irrigation volume:  30   Irrigation method:  Pressure wash and syringe   Visualized foreign bodies/material removed: no   Skin repair:    Repair method:  Sutures   Suture size:  5-0   Suture material:  Prolene   Number of sutures:  6 Approximation:    Approximation:  Close   Vermilion border: well-aligned   Post-procedure details:    Dressing:  Antibiotic ointment and bulky dressing   Patient tolerance of procedure:  Tolerated well, no immediate complications Comments:     Splint place.    (including critical care time)  Medications Ordered in ED Medications  lidocaine (PF) (XYLOCAINE) 1 % injection 2 mL (not administered)  Tdap (BOOSTRIX) injection 0.5 mL (not administered)     Initial Impression / Assessment and Plan / ED Course  I have reviewed the triage vital signs and the nursing notes.  Pertinent labs & imaging results that were available during my care of the patient were reviewed by me and considered in my medical decision making (see chart for  details).  Clinical Course  Tdap booster given.Pressure irrigation performed. Laceration occurred < 8 hours prior to repair which was well tolerated. Pt has no co morbidities to effect normal wound healing. Talked with Dr. Fredna Dow with ortho given patient is unable to flex finger and likely tendon rupture. He recommends to close wound. Give tdap and abx and place finger in splint. Will follow up in the office today for eval. Patient with good capillary refill and sensation intact. Dr. Florina Ou evaluated patient and agrees with the above plan. Pt is hemodynamically stable w no complaints prior to dc.  Patient verbalized understanding with plan of care. Strict return precautions given.   Final Clinical Impressions(s) / ED Diagnoses   Final diagnoses:  Laceration of left little finger without  foreign body without damage to nail, initial encounter    New Prescriptions New Prescriptions   DOXYCYCLINE (VIBRAMYCIN) 100 MG CAPSULE    Take 1 capsule (100 mg total) by mouth 2 (two) times daily.     Doristine Devoid, PA-C 12/06/15 [id number]    Shanon Rosser, MD 12/06/15 [phone]

## 2015-12-09 ENCOUNTER — Other Ambulatory Visit: Payer: Self-pay | Admitting: Orthopedic Surgery

## 2015-12-10 ENCOUNTER — Encounter (HOSPITAL_BASED_OUTPATIENT_CLINIC_OR_DEPARTMENT_OTHER): Payer: Self-pay | Admitting: *Deleted

## 2015-12-13 ENCOUNTER — Encounter (HOSPITAL_BASED_OUTPATIENT_CLINIC_OR_DEPARTMENT_OTHER)
Admission: RE | Disposition: A | Discharge status: Home / Self Care | Payer: Self-pay | Source: Ambulatory Visit | Attending: Orthopedic Surgery

## 2015-12-13 ENCOUNTER — Ambulatory Visit (HOSPITAL_BASED_OUTPATIENT_CLINIC_OR_DEPARTMENT_OTHER): Payer: BLUE CROSS/BLUE SHIELD | Admitting: Anesthesiology

## 2015-12-13 ENCOUNTER — Ambulatory Visit (HOSPITAL_BASED_OUTPATIENT_CLINIC_OR_DEPARTMENT_OTHER)
Admission: RE | Admit: 2015-12-13 | Discharge: 2015-12-13 | Disposition: A | Discharge status: Home / Self Care | Payer: BLUE CROSS/BLUE SHIELD | Source: Ambulatory Visit | Attending: Orthopedic Surgery | Admitting: Orthopedic Surgery

## 2015-12-13 ENCOUNTER — Encounter (HOSPITAL_BASED_OUTPATIENT_CLINIC_OR_DEPARTMENT_OTHER): Payer: Self-pay | Admitting: Anesthesiology

## 2015-12-13 DIAGNOSIS — S66327A Laceration of extensor muscle, fascia and tendon of left little finger at wrist and hand level, initial encounter: Secondary | ICD-10-CM | POA: Diagnosis not present

## 2015-12-13 DIAGNOSIS — W260XXA Contact with knife, initial encounter: Secondary | ICD-10-CM | POA: Insufficient documentation

## 2015-12-13 DIAGNOSIS — S66127A Laceration of flexor muscle, fascia and tendon of left little finger at wrist and hand level, initial encounter: Secondary | ICD-10-CM | POA: Insufficient documentation

## 2015-12-13 DIAGNOSIS — Z8249 Family history of ischemic heart disease and other diseases of the circulatory system: Secondary | ICD-10-CM | POA: Diagnosis not present

## 2015-12-13 HISTORY — PX: TENDON REPAIR: SHX5111

## 2015-12-13 SURGERY — TENDON REPAIR
Anesthesia: Monitor Anesthesia Care | Site: Finger | Laterality: Left

## 2015-12-13 MED ORDER — BUPIVACAINE HCL (PF) 0.25 % IJ SOLN
INTRAMUSCULAR | Status: AC
  Filled 2015-12-13: qty 30

## 2015-12-13 MED ORDER — LIDOCAINE 2% (20 MG/ML) 5 ML SYRINGE
INTRAMUSCULAR | Status: AC
  Filled 2015-12-13: qty 5

## 2015-12-13 MED ORDER — CEFAZOLIN SODIUM-DEXTROSE 2-4 GM/100ML-% IV SOLN
INTRAVENOUS | Status: AC
  Filled 2015-12-13: qty 100

## 2015-12-13 MED ORDER — FENTANYL CITRATE (PF) 100 MCG/2ML IJ SOLN
50.0000 ug | INTRAMUSCULAR | Status: DC | PRN
  Administered 2015-12-13: 50 ug via INTRAVENOUS

## 2015-12-13 MED ORDER — FENTANYL CITRATE (PF) 100 MCG/2ML IJ SOLN: 25.0000 ug | INTRAMUSCULAR | Status: DC | PRN

## 2015-12-13 MED ORDER — GLYCOPYRROLATE 0.2 MG/ML IJ SOLN: 0.2000 mg | Freq: Once | INTRAMUSCULAR | Status: DC | PRN

## 2015-12-13 MED ORDER — OXYCODONE HCL 5 MG PO TABS: 5.0000 mg | ORAL_TABLET | Freq: Once | ORAL | Status: DC | PRN

## 2015-12-13 MED ORDER — FENTANYL CITRATE (PF) 100 MCG/2ML IJ SOLN
INTRAMUSCULAR | Status: AC
  Filled 2015-12-13: qty 2

## 2015-12-13 MED ORDER — LIDOCAINE 2% (20 MG/ML) 5 ML SYRINGE
INTRAMUSCULAR | Status: DC | PRN
  Administered 2015-12-13: 50 mg via INTRAVENOUS

## 2015-12-13 MED ORDER — PROPOFOL 10 MG/ML IV BOLUS
INTRAVENOUS | Status: AC
  Filled 2015-12-13: qty 20

## 2015-12-13 MED ORDER — PROPOFOL 500 MG/50ML IV EMUL
INTRAVENOUS | Status: DC | PRN
  Administered 2015-12-13: 50 ug/kg/min via INTRAVENOUS

## 2015-12-13 MED ORDER — LACTATED RINGERS IV SOLN
INTRAVENOUS | Status: DC
  Administered 2015-12-13: 14:00:00 via INTRAVENOUS
  Administered 2015-12-13: 10 mL/h via INTRAVENOUS

## 2015-12-13 MED ORDER — ONDANSETRON HCL 4 MG/2ML IJ SOLN
INTRAMUSCULAR | Status: DC | PRN
  Administered 2015-12-13: 4 mg via INTRAVENOUS

## 2015-12-13 MED ORDER — SCOPOLAMINE 1 MG/3DAYS TD PT72: 1.0000 | MEDICATED_PATCH | Freq: Once | TRANSDERMAL | Status: DC | PRN

## 2015-12-13 MED ORDER — ROPIVACAINE HCL 7.5 MG/ML IJ SOLN
INTRAMUSCULAR | Status: DC | PRN
  Administered 2015-12-13: 30 mL via PERINEURAL

## 2015-12-13 MED ORDER — MIDAZOLAM HCL 2 MG/2ML IJ SOLN
1.0000 mg | INTRAMUSCULAR | Status: DC | PRN
  Administered 2015-12-13: 2 mg via INTRAVENOUS

## 2015-12-13 MED ORDER — CHLORHEXIDINE GLUCONATE 4 % EX LIQD: 60.0000 mL | Freq: Once | CUTANEOUS | Status: DC

## 2015-12-13 MED ORDER — MIDAZOLAM HCL 2 MG/2ML IJ SOLN
INTRAMUSCULAR | Status: AC
  Filled 2015-12-13: qty 2

## 2015-12-13 MED ORDER — HYDROCODONE-ACETAMINOPHEN 5-325 MG PO TABS: ORAL_TABLET | ORAL | 0 refills | Status: DC

## 2015-12-13 MED ORDER — DEXAMETHASONE SODIUM PHOSPHATE 10 MG/ML IJ SOLN
INTRAMUSCULAR | Status: AC
  Filled 2015-12-13: qty 1

## 2015-12-13 MED ORDER — OXYCODONE HCL 5 MG/5ML PO SOLN: 5.0000 mg | Freq: Once | ORAL | Status: DC | PRN

## 2015-12-13 MED ORDER — ONDANSETRON HCL 4 MG/2ML IJ SOLN
INTRAMUSCULAR | Status: AC
  Filled 2015-12-13: qty 2

## 2015-12-13 MED ORDER — CEFAZOLIN SODIUM-DEXTROSE 2-4 GM/100ML-% IV SOLN
2.0000 g | INTRAVENOUS | Status: AC
  Administered 2015-12-13: 2 g via INTRAVENOUS

## 2015-12-13 SURGICAL SUPPLY — 67 items
BAG DECANTER FOR FLEXI CONT (MISCELLANEOUS) IMPLANT
BANDAGE ACE 3X5.8 VEL STRL LF (GAUZE/BANDAGES/DRESSINGS) ×4 IMPLANT
BLADE MINI RND TIP GREEN BEAV (BLADE) IMPLANT
BLADE SURG 15 STRL LF DISP TIS (BLADE) ×4 IMPLANT
BLADE SURG 15 STRL SS (BLADE) ×8
BNDG CMPR 9X4 STRL LF SNTH (GAUZE/BANDAGES/DRESSINGS) ×2
BNDG ESMARK 4X9 LF (GAUZE/BANDAGES/DRESSINGS) ×3 IMPLANT
BNDG GAUZE ELAST 4 BULKY (GAUZE/BANDAGES/DRESSINGS) ×4 IMPLANT
BRUSH SCRUB EZ PLAIN DRY (MISCELLANEOUS) ×4 IMPLANT
CATH ROBINSON RED A/P 10FR (CATHETERS) IMPLANT
CHLORAPREP W/TINT 26ML (MISCELLANEOUS) ×4 IMPLANT
CORDS BIPOLAR (ELECTRODE) ×4 IMPLANT
COVER BACK TABLE 60X90IN (DRAPES) ×4 IMPLANT
COVER MAYO STAND STRL (DRAPES) ×4 IMPLANT
CUFF TOURNIQUET SINGLE 18IN (TOURNIQUET CUFF) ×3 IMPLANT
DECANTER SPIKE VIAL GLASS SM (MISCELLANEOUS) ×4 IMPLANT
DRAPE EXTREMITY T 121X128X90 (DRAPE) ×4 IMPLANT
DRAPE SURG 17X23 STRL (DRAPES) ×4 IMPLANT
GAUZE SPONGE 4X4 12PLY STRL (GAUZE/BANDAGES/DRESSINGS) ×4 IMPLANT
GAUZE XEROFORM 1X8 LF (GAUZE/BANDAGES/DRESSINGS) ×4 IMPLANT
GLOVE BIO SURGEON STRL SZ7.5 (GLOVE) ×4 IMPLANT
GLOVE BIOGEL PI IND STRL 7.0 (GLOVE) ×1 IMPLANT
GLOVE BIOGEL PI IND STRL 8 (GLOVE) ×2 IMPLANT
GLOVE BIOGEL PI IND STRL 8.5 (GLOVE) ×1 IMPLANT
GLOVE BIOGEL PI INDICATOR 7.0 (GLOVE) ×2
GLOVE BIOGEL PI INDICATOR 8 (GLOVE) ×2
GLOVE BIOGEL PI INDICATOR 8.5 (GLOVE) ×2
GLOVE ECLIPSE 6.5 STRL STRAW (GLOVE) ×3 IMPLANT
GLOVE SURG ORTHO 8.0 STRL STRW (GLOVE) ×3 IMPLANT
GOWN STRL REUS W/ TWL LRG LVL3 (GOWN DISPOSABLE) ×2 IMPLANT
GOWN STRL REUS W/TWL LRG LVL3 (GOWN DISPOSABLE) ×4
GOWN STRL REUS W/TWL XL LVL3 (GOWN DISPOSABLE) ×7 IMPLANT
LOOP VESSEL MAXI BLUE (MISCELLANEOUS) IMPLANT
NDL HYPO 25X1 1.5 SAFETY (NEEDLE) IMPLANT
NDL SAFETY ECLIPSE 18X1.5 (NEEDLE) IMPLANT
NEEDLE HYPO 18GX1.5 SHARP (NEEDLE)
NEEDLE HYPO 25X1 1.5 SAFETY (NEEDLE) ×4 IMPLANT
NS IRRIG 1000ML POUR BTL (IV SOLUTION) ×4 IMPLANT
PACK BASIN DAY SURGERY FS (CUSTOM PROCEDURE TRAY) ×4 IMPLANT
PAD CAST 3X4 CTTN HI CHSV (CAST SUPPLIES) ×2 IMPLANT
PAD CAST 4YDX4 CTTN HI CHSV (CAST SUPPLIES) IMPLANT
PADDING CAST ABS 4INX4YD NS (CAST SUPPLIES) ×2
PADDING CAST ABS COTTON 4X4 ST (CAST SUPPLIES) ×2 IMPLANT
PADDING CAST COTTON 3X4 STRL (CAST SUPPLIES) ×4
PADDING CAST COTTON 4X4 STRL (CAST SUPPLIES)
SLEEVE SCD COMPRESS KNEE MED (MISCELLANEOUS) ×3 IMPLANT
SPEAR EYE SURG WECK-CEL (MISCELLANEOUS) ×4 IMPLANT
SPLINT PLASTER CAST XFAST 3X15 (CAST SUPPLIES) IMPLANT
SPLINT PLASTER XTRA FASTSET 3X (CAST SUPPLIES)
STOCKINETTE 4X48 STRL (DRAPES) ×4 IMPLANT
SUT ETHIBOND 3-0 V-5 (SUTURE) IMPLANT
SUT ETHILON 4 0 PS 2 18 (SUTURE) ×4 IMPLANT
SUT FIBERWIRE 4-0 18 TAPR NDL (SUTURE)
SUT MERSILENE 6 0 P 1 (SUTURE) IMPLANT
SUT NYLON 9 0 VRM6 (SUTURE) IMPLANT
SUT PROLENE 6 0 P 1 18 (SUTURE) ×3 IMPLANT
SUT SILK 4 0 PS 2 (SUTURE) ×3 IMPLANT
SUT SUPRAMID 3-0 (SUTURE) ×3 IMPLANT
SUT SUPRAMID 4-0 (SUTURE) IMPLANT
SUT VICRYL 4-0 PS2 18IN ABS (SUTURE) IMPLANT
SUTURE FIBERWR 4-0 18 TAPR NDL (SUTURE) IMPLANT
SYR BULB 3OZ (MISCELLANEOUS) ×4 IMPLANT
SYR CONTROL 10ML LL (SYRINGE) IMPLANT
TOWEL OR 17X24 6PK STRL BLUE (TOWEL DISPOSABLE) ×8 IMPLANT
TRAY DSU PREP LF (CUSTOM PROCEDURE TRAY) IMPLANT
TUBE FEEDING 5FR 15 INCH (TUBING) ×3 IMPLANT
UNDERPAD 30X30 (UNDERPADS AND DIAPERS) ×4 IMPLANT

## 2015-12-13 NOTE — Transfer of Care (Signed)
Immediate Anesthesia Transfer of Care Note  Patient: Shannon Dean  Procedure(s) Performed: Procedure(s): Left small finger tendon repair, flexor (Left)  Patient Location: PACU  Anesthesia Type:MAC combined with regional for post-op pain  Level of Consciousness: sedated and patient cooperative  Airway & Oxygen Therapy: Patient Spontanous Breathing and Patient connected to face mask oxygen  Post-op Assessment: Report given to RN and Post -op Vital signs reviewed and stable  Post vital signs: Reviewed and stable  Last Vitals:  Vitals:   12/13/15 1214 12/13/15 1315  BP: 105/68 106/75  Pulse: 61 64  Resp: 18 15  Temp: 36.8 C     Last Pain:  Vitals:   12/13/15 1214  TempSrc: Oral         Complications: No apparent anesthesia complications

## 2015-12-13 NOTE — Discharge Instructions (Addendum)

## 2015-12-13 NOTE — Anesthesia Preprocedure Evaluation (Signed)
Anesthesia Evaluation  Patient identified by MRN, date of birth, ID band Patient awake    Reviewed: Allergy & Precautions, NPO status , Patient's Chart, lab work & pertinent test results  History of Anesthesia Complications Negative for: history of anesthetic complications  Airway Mallampati: I  TM Distance: >3 FB Neck ROM: Full    Dental  (+) Teeth Intact   Pulmonary neg pulmonary ROS,    breath sounds clear to auscultation       Cardiovascular negative cardio ROS   Rhythm:Regular     Neuro/Psych  Headaches, negative psych ROS   GI/Hepatic negative GI ROS, Neg liver ROS,   Endo/Other  negative endocrine ROS  Renal/GU negative Renal ROS     Musculoskeletal negative musculoskeletal ROS (+)   Abdominal   Peds  Hematology negative hematology ROS (+)   Anesthesia Other Findings   Reproductive/Obstetrics                             Anesthesia Physical Anesthesia Plan  ASA: I  Anesthesia Plan: MAC and Regional   Post-op Pain Management:    Induction:   Airway Management Planned: Natural Airway, Nasal Cannula and Simple Face Mask  Additional Equipment: None  Intra-op Plan:   Post-operative Plan:   Informed Consent: I have reviewed the patients History and Physical, chart, labs and discussed the procedure including the risks, benefits and alternatives for the proposed anesthesia with the patient or authorized representative who has indicated his/her understanding and acceptance.   Dental advisory given  Plan Discussed with: CRNA and Surgeon  Anesthesia Plan Comments:         Anesthesia Quick Evaluation

## 2015-12-13 NOTE — H&P (Signed)
  Shannon Dean is an 40 y.o. female.   Chief Complaint: left small finger laceration HPI: 40 yo rhd female states she lacerated left small finger cutting chicken.  Seen at ED where wound cleaned and sutured.  Followed up in office.  Unable to flex small finger.    Allergies: No Known Allergies  Past Medical History:  Diagnosis Date  . Migraines   . Tendon laceration 11/2015   left small finger flexor tendon    Past Surgical History:  Procedure Laterality Date  . NO PAST SURGERIES      Family History: Family History  Problem Relation Age of Onset  . Hypertension Mother     Social History:   reports that she has never smoked. She has never used smokeless tobacco. She reports that she does not drink alcohol or use drugs.  Medications: No prescriptions prior to admission.    No results found for this or any previous visit (from the past 48 hour(s)).  No results found.   A comprehensive review of systems was negative.  Blood pressure 106/75, pulse 64, temperature 98.2 F (36.8 C), temperature source Oral, resp. rate 15, height 5' (1.524 m), weight 66.2 kg (146 lb), last menstrual period 12/13/2015, SpO2 100 %.  General appearance: alert, cooperative and appears stated age Head: Normocephalic, without obvious abnormality, atraumatic Neck: supple, symmetrical, trachea midline Resp: clear to auscultation bilaterally Cardio: regular rate and rhythm GI: non-tender Extremities: Intact sensation and capillary refill all digits.  +epl/fpl/io.  Laceration at proximal flexion crease of small finger.  Unable to flex small finger. Pulses: 2+ and symmetric Skin: Skin color, texture, turgor normal. No rashes or lesions Neurologic: Grossly normal Incision/Wound:as above  Assessment/Plan Left small finger laceration with tendon injury.  Recommend OR for repair of tendon/artery/nerve as necessary.  Risks, benefits, and alternatives of surgery were discussed and the patient agrees  with the plan of care.   Adante Courington R 12/13/2015, 1:36 PM

## 2015-12-13 NOTE — Anesthesia Procedure Notes (Signed)
Anesthesia Regional Block:  Axillary brachial plexus block  Pre-Anesthetic Checklist: ,, timeout performed, Correct Patient, Correct Site, Correct Laterality, Correct Procedure, Correct Position, site marked, Risks and benefits discussed,  Surgical consent,  Pre-op evaluation,  At surgeon's request and post-op pain management  Laterality: Upper and Left  Prep: chloraprep       Needles:  Injection technique: Single-shot  Needle Type: Echogenic Needle          Additional Needles:  Procedures: ultrasound guided (picture in chart) Axillary brachial plexus block Narrative:  Injection made incrementally with aspirations every 5 mL.  Performed by: Personally   Additional Notes: H+P and labs reviewed, risks and benefits discussed with patient, procedure tolerated well without complications

## 2015-12-13 NOTE — Op Note (Signed)
I assisted Surgeon(s) and Role:    * Leanora Cover, MD - Primary    * Daryll Brod, MD - Assisting on the Procedure(s): Left small finger tendon repair, flexor on 12/13/2015.  I provided assistance on this case as follows: approach, exploration, debridement,repair of tendons, closure  And application of dressing and splints. I was present for the entire case.  Electronically signed by: Wynonia Sours, MD Date: 12/13/2015 Time: 3:01 PM

## 2015-12-13 NOTE — Op Note (Signed)
[id number] 

## 2015-12-13 NOTE — Brief Op Note (Signed)
12/13/2015  3:01 PM  PATIENT:  Shannon Dean  40 y.o. female  PRE-OPERATIVE DIAGNOSIS:  left small finger flexor tendon laceration  POST-OPERATIVE DIAGNOSIS:  left small finger flexor tendon laceration  PROCEDURE:  Procedure(s): Left small finger tendon repair, flexor (Left)  SURGEON:  Surgeon(s) and Role:    * Leanora Cover, MD - Primary    * Daryll Brod, MD - Assisting  PHYSICIAN ASSISTANT:   ASSISTANTS: Daryll Brod, MD   ANESTHESIA:   regional and sedation  EBL:  Total I/O In: 500 [I.V.:500] Out: -   BLOOD ADMINISTERED:none  DRAINS: none   LOCAL MEDICATIONS USED:  NONE  SPECIMEN:  No Specimen  DISPOSITION OF SPECIMEN:  N/A  COUNTS:  YES  TOURNIQUET:   Total Tourniquet Time Documented: Upper Arm (Left) - 45 minutes Total: Upper Arm (Left) - 45 minutes   DICTATION: .Other Dictation: Dictation Number [id number]  PLAN OF CARE: Discharge to home after PACU  PATIENT DISPOSITION:  PACU - hemodynamically stable.

## 2015-12-13 NOTE — Progress Notes (Signed)
Assisted Dr. Moser with left, ultrasound guided, axillary block. Side rails up, monitors on throughout procedure. See vital signs in flow sheet. Tolerated Procedure well. ° °

## 2015-12-14 NOTE — Op Note (Signed)
NAMEMISTE, KLUEVER                 ACCOUNT NO.:  [id number]  MEDICAL RECORD NO.:  [id number]  LOCATION:                                 FACILITY:  PHYSICIAN:  Leanora Cover, MD        DATE OF BIRTH:  09-13-1975  DATE OF PROCEDURE:  12/13/2015 DATE OF DISCHARGE:                              OPERATIVE REPORT   PREOPERATIVE DIAGNOSIS:  Left small finger flexor tendon laceration.  POSTOPERATIVE DIAGNOSIS:  Left small finger FDP and FDS laceration.  PROCEDURE:   1. Left small finger repair of FDP zone 2 laceration  2. Left small finger repair of FDS zone 2 laceration.  SURGEON:  Leanora Cover, MD.  ASSISTANT:  Daryll Brod, MD.  ANESTHESIA:  Regional with sedation.  IV FLUIDS:  Per anesthesia flow sheet.  ESTIMATED BLOOD LOSS:  Minimal.  COMPLICATIONS:  None.  SPECIMENS:  None.  TIME OF TOURNIQUET:  45 minutes.  DISPOSITION:  Stable to PACU.  INDICATIONS:  Ms. Pawelek is a 40 year old right-hand dominant female who states she lacerated the left small finger while cutting chicken.  She was seen at the emergency department where the wound was sutured, she followed up in the office.  She had inability to flex the small finger. I recommend operative exploration with repair of tendon, artery, and nerve as necessary.  Risks, benefits, and alternatives of surgery were discussed including risk of blood loss; infection; damage to nerves, vessels, tendons, ligaments, bone; failure of surgery; need for additional surgery; complications with wound healing; continued pain; and stiffness.  She voiced understanding of these risks and elected to proceed.  OPERATIVE COURSE:  After being identified preoperatively by myself, the patient and I agreed upon procedure and site of procedure.  Surgical site was marked.  The risks, benefits, and alternatives of surgery were reviewed and she wished to proceed.  Surgical consent had been signed. A regional block was performed by Anesthesia in  preoperative holding. She was given IV Ancef as preoperative antibiotic prophylaxis.  DESCRIPTION OF PROCEDURE:  She was transferred to the operating room and placed on the operating room table in supine position with the left upper extremity on arm board.  Sedation was induced by Anesthesiology. Left upper extremity was prepped and draped in normal sterile orthopedic fashion.  Surgical pause was performed between surgeons, anesthesia, and operating staff; and all were in agreement to the patient, procedure, and site of procedure.  Tourniquet at the proximal aspect of the extremity was inflated to 250 mmHg after exsanguination of the limb with an Esmarch bandage.  The wound was opened.  The sutures had been removed.  There was noted to be laceration of both the FDP and FDS tendons.  The wound was extended both proximally and distally to aid in visualization.  The radial and ulnar digital nerve and artery were identified and were noted to be intact.  They were protected throughout the case.  The laceration was at the distal aspect of the A2 pulley. The remaining 1-2 mm of the A2 pulley distally was divided as well as the cruciate pulley to allow visualization of repair.  The FDP tendon was able to be  readvanced through the flexor sheath.  A 3-0 looped Supramid suture was then used in a modified Kessler fashion to perform the repair to the FDP tendon.  This provided good apposition of the tendon ends.  A 6-0 Prolene was used as a running epitendinous suture. The radial side of the FDS tendon was then excised.  The ulnar arm of the FDS tendon was then repaired to its stump as well.  This was done using a 6-0 Prolene suture.  There was no gap in the tendon repairs. The wound was copiously irrigated with sterile saline and closed with 4- 0 nylon in a horizontal mattress fashion.  It was then dressed with sterile Xeroform, 4x4s, and wrapped with a Kerlix bandage.  A dorsal blocking splint was  placed and wrapped with Kerlix and Ace bandage. Tourniquet was deflated at 45 minutes.  Fingertips were pink with brisk capillary refill after deflation of tourniquet.  Operative drapes were broken down, and the patient was awoken from anesthesia safely.  She was transferred back to stretcher and taken to PACU in stable condition.  I will see her back in the office in 1 week for postoperative followup.  I will give her Norco 5/325 one to two p.o. q.6 hours p.r.n. pain, dispensed #30.     Leanora Cover, MD     KK/MEDQ  D:  12/13/2015  T:  12/14/2015  Job:  [id number]

## 2015-12-14 NOTE — Anesthesia Postprocedure Evaluation (Signed)
Anesthesia Post Note  Patient: Shannon Dean  Procedure(s) Performed: Procedure(s) (LRB): Left small finger tendon repair, flexor (Left)  Patient location during evaluation: PACU Anesthesia Type: MAC and Regional Level of consciousness: awake Pain management: pain level controlled Vital Signs Assessment: post-procedure vital signs reviewed and stable Respiratory status: spontaneous breathing Cardiovascular status: stable Postop Assessment: no signs of nausea or vomiting Anesthetic complications: no    Last Vitals:  Vitals:   12/13/15 1530 12/13/15 1600  BP: 123/79 112/76  Pulse: 66 71  Resp: 17 14  Temp:  36.4 C    Last Pain:  Vitals:   12/13/15 1600  TempSrc:   PainSc: 0-No pain                 Serita Degroote

## 2015-12-16 ENCOUNTER — Encounter (HOSPITAL_BASED_OUTPATIENT_CLINIC_OR_DEPARTMENT_OTHER): Payer: Self-pay | Admitting: Orthopedic Surgery

## 2016-03-18 ENCOUNTER — Other Ambulatory Visit: Payer: Self-pay | Admitting: Orthopedic Surgery

## 2016-03-18 DIAGNOSIS — S66127D Laceration of flexor muscle, fascia and tendon of left little finger at wrist and hand level, subsequent encounter: Secondary | ICD-10-CM

## 2016-03-24 ENCOUNTER — Ambulatory Visit
Admission: RE | Admit: 2016-03-24 | Discharge: 2016-03-24 | Disposition: A | Discharge status: Home / Self Care | Payer: BLUE CROSS/BLUE SHIELD | Source: Ambulatory Visit | Attending: Orthopedic Surgery | Admitting: Orthopedic Surgery

## 2016-03-24 DIAGNOSIS — S66127D Laceration of flexor muscle, fascia and tendon of left little finger at wrist and hand level, subsequent encounter: Secondary | ICD-10-CM

## 2016-03-26 ENCOUNTER — Other Ambulatory Visit: Payer: BLUE CROSS/BLUE SHIELD

## 2016-05-14 ENCOUNTER — Other Ambulatory Visit: Payer: Self-pay | Admitting: Orthopedic Surgery

## 2016-05-17 DIAGNOSIS — M779 Enthesopathy, unspecified: Secondary | ICD-10-CM

## 2016-05-17 HISTORY — DX: Enthesopathy, unspecified: M77.9

## 2016-05-21 ENCOUNTER — Encounter (HOSPITAL_BASED_OUTPATIENT_CLINIC_OR_DEPARTMENT_OTHER): Payer: Self-pay | Admitting: *Deleted

## 2016-05-28 ENCOUNTER — Ambulatory Visit (HOSPITAL_BASED_OUTPATIENT_CLINIC_OR_DEPARTMENT_OTHER)
Admission: RE | Admit: 2016-05-28 | Discharge: 2016-05-28 | Disposition: A | Discharge status: Home / Self Care | Payer: BLUE CROSS/BLUE SHIELD | Source: Ambulatory Visit | Attending: Orthopedic Surgery | Admitting: Orthopedic Surgery

## 2016-05-28 ENCOUNTER — Encounter (HOSPITAL_BASED_OUTPATIENT_CLINIC_OR_DEPARTMENT_OTHER)
Admission: RE | Disposition: A | Discharge status: Home / Self Care | Payer: Self-pay | Source: Ambulatory Visit | Attending: Orthopedic Surgery

## 2016-05-28 ENCOUNTER — Ambulatory Visit (HOSPITAL_BASED_OUTPATIENT_CLINIC_OR_DEPARTMENT_OTHER): Payer: BLUE CROSS/BLUE SHIELD | Admitting: Anesthesiology

## 2016-05-28 ENCOUNTER — Encounter (HOSPITAL_BASED_OUTPATIENT_CLINIC_OR_DEPARTMENT_OTHER): Payer: Self-pay | Admitting: *Deleted

## 2016-05-28 DIAGNOSIS — Z9889 Other specified postprocedural states: Secondary | ICD-10-CM | POA: Diagnosis not present

## 2016-05-28 DIAGNOSIS — M67844 Other specified disorders of tendon, left hand: Secondary | ICD-10-CM | POA: Diagnosis not present

## 2016-05-28 DIAGNOSIS — Z8249 Family history of ischemic heart disease and other diseases of the circulatory system: Secondary | ICD-10-CM | POA: Insufficient documentation

## 2016-05-28 HISTORY — PX: TENOLYSIS: SHX396

## 2016-05-28 HISTORY — DX: Enthesopathy, unspecified: M77.9

## 2016-05-28 HISTORY — DX: Nausea with vomiting, unspecified: R11.2

## 2016-05-28 HISTORY — DX: Other specified postprocedural states: Z98.890

## 2016-05-28 SURGERY — INCISION, TENDON SHEATH
Anesthesia: Monitor Anesthesia Care | Site: Finger | Laterality: Left

## 2016-05-28 MED ORDER — CHLORHEXIDINE GLUCONATE 4 % EX LIQD: 60.0000 mL | Freq: Once | CUTANEOUS | Status: DC

## 2016-05-28 MED ORDER — BUPIVACAINE-EPINEPHRINE (PF) 0.5% -1:200000 IJ SOLN
INTRAMUSCULAR | Status: DC | PRN
  Administered 2016-05-28: 30 mL via PERINEURAL

## 2016-05-28 MED ORDER — FENTANYL CITRATE (PF) 100 MCG/2ML IJ SOLN
50.0000 ug | INTRAMUSCULAR | Status: DC | PRN
  Administered 2016-05-28: 50 ug via INTRAVENOUS

## 2016-05-28 MED ORDER — MEPERIDINE HCL 25 MG/ML IJ SOLN: 6.2500 mg | INTRAMUSCULAR | Status: DC | PRN

## 2016-05-28 MED ORDER — FENTANYL CITRATE (PF) 100 MCG/2ML IJ SOLN
INTRAMUSCULAR | Status: AC
  Filled 2016-05-28: qty 2

## 2016-05-28 MED ORDER — ONDANSETRON HCL 4 MG/2ML IJ SOLN
INTRAMUSCULAR | Status: AC
  Filled 2016-05-28: qty 2

## 2016-05-28 MED ORDER — HYDROCODONE-ACETAMINOPHEN 5-325 MG PO TABS
ORAL_TABLET | ORAL | 0 refills | Status: DC
Start: 2016-05-28 — End: 2018-07-05

## 2016-05-28 MED ORDER — PROPOFOL 500 MG/50ML IV EMUL
INTRAVENOUS | Status: AC
  Filled 2016-05-28: qty 100

## 2016-05-28 MED ORDER — SCOPOLAMINE 1 MG/3DAYS TD PT72
1.0000 | MEDICATED_PATCH | Freq: Once | TRANSDERMAL | Status: DC | PRN
Start: 2016-05-28 — End: 2016-05-28

## 2016-05-28 MED ORDER — PHENYLEPHRINE 40 MCG/ML (10ML) SYRINGE FOR IV PUSH (FOR BLOOD PRESSURE SUPPORT)
PREFILLED_SYRINGE | INTRAVENOUS | Status: AC
  Filled 2016-05-28: qty 10

## 2016-05-28 MED ORDER — CEFAZOLIN SODIUM-DEXTROSE 2-4 GM/100ML-% IV SOLN
INTRAVENOUS | Status: AC
  Filled 2016-05-28: qty 100

## 2016-05-28 MED ORDER — 0.9 % SODIUM CHLORIDE (POUR BTL) OPTIME
TOPICAL | Status: DC | PRN
  Administered 2016-05-28: 200 mL

## 2016-05-28 MED ORDER — LACTATED RINGERS IV SOLN
INTRAVENOUS | Status: DC
  Administered 2016-05-28: 12:00:00 via INTRAVENOUS

## 2016-05-28 MED ORDER — HYDROMORPHONE HCL 1 MG/ML IJ SOLN: 0.2500 mg | INTRAMUSCULAR | Status: DC | PRN

## 2016-05-28 MED ORDER — PROPOFOL 500 MG/50ML IV EMUL
INTRAVENOUS | Status: DC | PRN
  Administered 2016-05-28: 75 ug/kg/min via INTRAVENOUS

## 2016-05-28 MED ORDER — MIDAZOLAM HCL 2 MG/2ML IJ SOLN
INTRAMUSCULAR | Status: AC
  Filled 2016-05-28: qty 2

## 2016-05-28 MED ORDER — PHENYLEPHRINE HCL 10 MG/ML IJ SOLN
INTRAMUSCULAR | Status: DC | PRN
  Administered 2016-05-28: 80 ug via INTRAVENOUS

## 2016-05-28 MED ORDER — MIDAZOLAM HCL 2 MG/2ML IJ SOLN
1.0000 mg | INTRAMUSCULAR | Status: DC | PRN
  Administered 2016-05-28: 2 mg via INTRAVENOUS

## 2016-05-28 MED ORDER — CEFAZOLIN SODIUM-DEXTROSE 2-4 GM/100ML-% IV SOLN
2.0000 g | INTRAVENOUS | Status: AC
  Administered 2016-05-28: 2 g via INTRAVENOUS

## 2016-05-28 MED ORDER — ONDANSETRON HCL 4 MG/2ML IJ SOLN
4.0000 mg | Freq: Once | INTRAMUSCULAR | Status: AC | PRN
  Administered 2016-05-28: 4 mg via INTRAVENOUS

## 2016-05-28 MED ORDER — BUPIVACAINE HCL (PF) 0.5 % IJ SOLN
INTRAMUSCULAR | Status: AC
  Filled 2016-05-28: qty 30

## 2016-05-28 SURGICAL SUPPLY — 83 items
BAG DECANTER FOR FLEXI CONT (MISCELLANEOUS) IMPLANT
BALL CTTN LRG ABS STRL LF (GAUZE/BANDAGES/DRESSINGS)
BANDAGE ACE 3X5.8 VEL STRL LF (GAUZE/BANDAGES/DRESSINGS) ×3 IMPLANT
BLADE MINI RND TIP GREEN BEAV (BLADE) ×2 IMPLANT
BLADE SURG 15 STRL LF DISP TIS (BLADE) ×2 IMPLANT
BLADE SURG 15 STRL SS (BLADE) ×6
BNDG CMPR 9X4 STRL LF SNTH (GAUZE/BANDAGES/DRESSINGS) ×1
BNDG CONFORM 2 STRL LF (GAUZE/BANDAGES/DRESSINGS) IMPLANT
BNDG ELASTIC 2X5.8 VLCR STR LF (GAUZE/BANDAGES/DRESSINGS) IMPLANT
BNDG ESMARK 4X9 LF (GAUZE/BANDAGES/DRESSINGS) ×3 IMPLANT
BNDG GAUZE ELAST 4 BULKY (GAUZE/BANDAGES/DRESSINGS) ×2 IMPLANT
CATH ROBINSON RED A/P 10FR (CATHETERS) IMPLANT
CHLORAPREP W/TINT 26ML (MISCELLANEOUS) ×3 IMPLANT
CORDS BIPOLAR (ELECTRODE) ×3 IMPLANT
COTTONBALL LRG STERILE PKG (GAUZE/BANDAGES/DRESSINGS) IMPLANT
COVER BACK TABLE 60X90IN (DRAPES) ×3 IMPLANT
COVER MAYO STAND STRL (DRAPES) ×3 IMPLANT
CUFF TOURNIQUET SINGLE 18IN (TOURNIQUET CUFF) ×3 IMPLANT
DECANTER SPIKE VIAL GLASS SM (MISCELLANEOUS) IMPLANT
DRAIN TLS ROUND 10FR (DRAIN) IMPLANT
DRAPE EXTREMITY T 121X128X90 (DRAPE) ×3 IMPLANT
DRAPE OEC MINIVIEW 54X84 (DRAPES) IMPLANT
DRAPE SURG 17X23 STRL (DRAPES) ×3 IMPLANT
DRSG PAD ABDOMINAL 8X10 ST (GAUZE/BANDAGES/DRESSINGS) IMPLANT
GAUZE SPONGE 4X4 12PLY STRL (GAUZE/BANDAGES/DRESSINGS) ×3 IMPLANT
GAUZE SPONGE 4X4 16PLY XRAY LF (GAUZE/BANDAGES/DRESSINGS) IMPLANT
GAUZE XEROFORM 1X8 LF (GAUZE/BANDAGES/DRESSINGS) ×3 IMPLANT
GLOVE BIO SURGEON STRL SZ 6.5 (GLOVE) ×1 IMPLANT
GLOVE BIO SURGEON STRL SZ7.5 (GLOVE) ×3 IMPLANT
GLOVE BIO SURGEONS STRL SZ 6.5 (GLOVE) ×1
GLOVE BIOGEL PI IND STRL 7.0 (GLOVE) IMPLANT
GLOVE BIOGEL PI IND STRL 8 (GLOVE) ×1 IMPLANT
GLOVE BIOGEL PI IND STRL 8.5 (GLOVE) IMPLANT
GLOVE BIOGEL PI INDICATOR 7.0 (GLOVE) ×2
GLOVE BIOGEL PI INDICATOR 8 (GLOVE) ×2
GLOVE BIOGEL PI INDICATOR 8.5 (GLOVE) ×2
GLOVE SURG ORTHO 8.0 STRL STRW (GLOVE) ×2 IMPLANT
GOWN STRL REUS W/ TWL LRG LVL3 (GOWN DISPOSABLE) ×1 IMPLANT
GOWN STRL REUS W/TWL LRG LVL3 (GOWN DISPOSABLE) ×3
GOWN STRL REUS W/TWL XL LVL3 (GOWN DISPOSABLE) ×5 IMPLANT
K-WIRE .035X4 (WIRE) IMPLANT
LOOP VESSEL MAXI BLUE (MISCELLANEOUS) IMPLANT
NDL HYPO 25X1 1.5 SAFETY (NEEDLE) IMPLANT
NDL KEITH (NEEDLE) IMPLANT
NEEDLE HYPO 25X1 1.5 SAFETY (NEEDLE) IMPLANT
NEEDLE KEITH (NEEDLE) IMPLANT
NS IRRIG 1000ML POUR BTL (IV SOLUTION) ×3 IMPLANT
PACK BASIN DAY SURGERY FS (CUSTOM PROCEDURE TRAY) ×3 IMPLANT
PAD CAST 3X4 CTTN HI CHSV (CAST SUPPLIES) ×1 IMPLANT
PAD CAST 4YDX4 CTTN HI CHSV (CAST SUPPLIES) IMPLANT
PADDING CAST ABS 3INX4YD NS (CAST SUPPLIES)
PADDING CAST ABS 4INX4YD NS (CAST SUPPLIES) ×2
PADDING CAST ABS COTTON 3X4 (CAST SUPPLIES) IMPLANT
PADDING CAST ABS COTTON 4X4 ST (CAST SUPPLIES) ×1 IMPLANT
PADDING CAST COTTON 3X4 STRL (CAST SUPPLIES) ×3
PADDING CAST COTTON 4X4 STRL (CAST SUPPLIES)
SLEEVE SCD COMPRESS KNEE MED (MISCELLANEOUS) IMPLANT
SLING ARM FOAM STRAP MED (SOFTGOODS) ×2 IMPLANT
SPLINT PLASTER CAST XFAST 3X15 (CAST SUPPLIES) IMPLANT
SPLINT PLASTER CAST XFAST 4X15 (CAST SUPPLIES) IMPLANT
SPLINT PLASTER XTRA FAST SET 4 (CAST SUPPLIES)
SPLINT PLASTER XTRA FASTSET 3X (CAST SUPPLIES)
STOCKINETTE 4X48 STRL (DRAPES) ×3 IMPLANT
SUT CHROMIC 5 0 P 3 (SUTURE) IMPLANT
SUT ETHIBOND 3-0 V-5 (SUTURE) IMPLANT
SUT ETHILON 3 0 PS 1 (SUTURE) IMPLANT
SUT ETHILON 4 0 PS 2 18 (SUTURE) IMPLANT
SUT FIBERWIRE 4-0 18 DIAM BLUE (SUTURE)
SUT MERSILENE 2.0 SH NDLE (SUTURE) IMPLANT
SUT MERSILENE 4 0 P 3 (SUTURE) IMPLANT
SUT PROLENE 2 0 SH DA (SUTURE) IMPLANT
SUT PROLENE 5 0 P 3 (SUTURE) IMPLANT
SUT SILK 4 0 PS 2 (SUTURE) ×2 IMPLANT
SUT SUPRAMID 4-0 (SUTURE) IMPLANT
SUT VIC AB 4-0 P-3 18XBRD (SUTURE) IMPLANT
SUT VIC AB 4-0 P3 18 (SUTURE)
SUT VICRYL 4-0 PS2 18IN ABS (SUTURE) IMPLANT
SUTURE FIBERWR 4-0 18 DIA BLUE (SUTURE) IMPLANT
SYR BULB 3OZ (MISCELLANEOUS) ×3 IMPLANT
SYR CONTROL 10ML LL (SYRINGE) IMPLANT
TOWEL OR 17X24 6PK STRL BLUE (TOWEL DISPOSABLE) ×6 IMPLANT
TUBE FEEDING ENTERAL 5FR 16IN (TUBING) IMPLANT
UNDERPAD 30X30 (UNDERPADS AND DIAPERS) ×3 IMPLANT

## 2016-05-28 NOTE — Anesthesia Procedure Notes (Signed)
Procedure Name: MAC Date/Time: 05/28/2016 3:47 PM Performed by: Marrianne Mood Pre-anesthesia Checklist: Patient identified, Timeout performed, Emergency Drugs available, Suction available and Patient being monitored Patient Re-evaluated:Patient Re-evaluated prior to inductionOxygen Delivery Method: Simple face mask Preoxygenation: Pre-oxygenation with 100% oxygen

## 2016-05-28 NOTE — Discharge Instructions (Addendum)

## 2016-05-28 NOTE — Op Note (Signed)
NAMEABBEY, VEITH                 ACCOUNT NO.:  [id number]  MEDICAL RECORD NO.:  [id number]  LOCATION:                                 FACILITY:  PHYSICIAN:  Leanora Cover, MD        DATE OF BIRTH:  1976-01-06  DATE OF PROCEDURE:  05/28/2016 DATE OF DISCHARGE:                              OPERATIVE REPORT   PREOPERATIVE DIAGNOSIS:  Left small finger tendon adhesions post tendon repair.  POSTOPERATIVE DIAGNOSIS:  Left small finger tendon adhesions post tendon repair.  PROCEDURE:  Right small finger flexor tenolysis in digit.  SURGEON:  Leanora Cover, MD.  ASSISTANT:  Daryll Brod, MD.  ANESTHESIA:  Regional with MAC.  IV FLUIDS:  Per Anesthesia flow sheet.  ESTIMATED BLOOD LOSS:  Minimal.  COMPLICATIONS:  None.  SPECIMENS:  None.  TOURNIQUET TIME:  59 minutes.  DISPOSITION:  Stable to PACU.  INDICATIONS:  Ms. Lopata is a 42 year old female who underwent repair of left small finger flexor tendon injury approximately 6 months ago.  She has had stiffness in the finger.  She wished to proceed with attempted tenolysis versus reconstruction of the flexor tendon.  Risks, benefits, and alternatives of surgery were discussed including the risk of blood loss; infection; damage to nerves, vessels, tendons, ligaments, bone; failure for surgery; need for additional surgery; complications with wound healing; continued pain; and continued stiffness.  She voiced understanding of these risks and elected to proceed.  OPERATIVE COURSE:  After being identified preoperatively by myself, the patient and I agreed upon procedure and site of procedure.  Surgical site was marked.  The risks, benefits, and alternatives of surgery were reviewed, and she wished to proceed.  Surgical consent had been signed. She was given IV antibiotics as preoperative antibiotic prophylaxis. She was transported to the operating room, and placed on the operating room table in supine position with left upper extremity  on arm board. MAC anesthesia was induced by anesthesiologist.  A regional block had been performed by Anesthesia in preoperative holding.  Left upper extremity was prepped and draped in normal sterile orthopedic fashion. Surgical pause was performed between surgeons, anesthesia, operating room staff; and all were in agreement as to the patient, procedure, and site of procedure.  Tourniquet at the proximal aspect of the extremity was inflated to 250 mmHg after exsanguination of the limb with an Esmarch bandage.  Incision was made in the small finger following the previous operative incision.  This was carried into subcutaneous tissues by spreading technique.  There was scar formation.  The radial and ulnar digital nerve and artery were identified and protected throughout the case.  The flexor sheath was entered both at the proximal and distal ends of the incision.  There was scar formation within it.  The __________ and tenolysis knife were carefully used to free up any adhesions.  There were significant adhesions within the tendon sheath. The FDS tendon was then incompetent and ruptured.  The FDP tendon, I was able to pull down the PIP joint with a flicker of motion at the DIP joint.  The tendon repair was noted.  There was some element of pseudotendon in this area.  It was felt that allowing this to remain was appropriate.  The wound was copiously irrigated with sterile saline.  It was then closed with 4-0 nylon in a horizontal mattress fashion.  It was dressed with sterile Xeroform, 4x4s, and wrapped with a Kerlix bandage. A dorsal blocking splint was placed and wrapped with Kerlix and Ace bandage for comfort.  The tourniquet was deflated at 59 minutes. Fingertips were pink with brisk capillary refill after deflation of tourniquet.  Operative drapes were broken down, the patient was awoken from anesthesia safely.  She was transferred back to the stretcher and taken to PACU in stable  condition.  I will see her back in the office in 1 week.  She will start therapy in the next 1 to 2 days for motion exercises.  We will give her Norco 5/325 one to two p.o. q.6 hours p.r.n. pain, dispensed #20.     Leanora Cover, MD     KK/MEDQ  D:  05/28/2016  T:  05/28/2016  Job:  [id number]

## 2016-05-28 NOTE — Transfer of Care (Signed)
Immediate Anesthesia Transfer of Care Note  Patient: Shannon Dean  Procedure(s) Performed: Procedure(s): LEFT SMALL TENOLYSIS POSSIBLE TENDON GRAFT (Left)  Patient Location: PACU  Anesthesia Type:MAC and Regional  Level of Consciousness: awake and sedated  Airway & Oxygen Therapy: Patient Spontanous Breathing and Patient connected to face mask oxygen  Post-op Assessment: Report given to RN and Post -op Vital signs reviewed and stable  Post vital signs: Reviewed and stable  Last Vitals:  Vitals:   05/28/16 1445 05/28/16 1450  BP: 107/70   Pulse: 79 80  Resp: 14 15  Temp:      Last Pain:  Vitals:   05/28/16 1139  TempSrc: Oral  PainSc:          Complications: No apparent anesthesia complications

## 2016-05-28 NOTE — Anesthesia Postprocedure Evaluation (Signed)
Anesthesia Post Note  Patient: Shannon Dean  Procedure(s) Performed: Procedure(s) (LRB): LEFT SMALL TENOLYSIS (Left)  Patient location during evaluation: PACU Anesthesia Type: Regional Level of consciousness: awake and alert and patient cooperative Pain management: pain level controlled Vital Signs Assessment: post-procedure vital signs reviewed and stable Respiratory status: spontaneous breathing and respiratory function stable Cardiovascular status: stable Anesthetic complications: no       Last Vitals:  Vitals:   05/28/16 1638 05/28/16 1645  BP: (!) 104/59 103/66  Pulse: 79 68  Resp: 20 16  Temp: 36.6 C     Last Pain:  Vitals:   05/28/16 1638  TempSrc:   PainSc: 0-No pain                 Faun Mcqueen DAVID

## 2016-05-28 NOTE — Anesthesia Procedure Notes (Addendum)
Anesthesia Regional Block: Supraclavicular block   Pre-Anesthetic Checklist: ,, timeout performed, Correct Patient, Correct Site, Correct Laterality, Correct Procedure, Correct Position, site marked, Risks and benefits discussed,  Surgical consent,  Pre-op evaluation,  At surgeon's request and post-op pain management  Laterality: Left  Prep: chloraprep       Needles:   Needle Type: Other     Needle Length: 9cm  Needle Gauge: 21     Additional Needles:   Procedures: ultrasound guided, nerve stimulator,,,,,,   Nerve Stimulator or Paresthesia:  Response: 0.4 mA,   Additional Responses:   Narrative:  Start time: 05/28/2016 2:30 PM End time: 05/28/2016 2:42 PM Injection made incrementally with aspirations every 5 mL.  Performed by: Personally  Anesthesiologist: Lillia Abed  Additional Notes: Monitors applied. Patient sedated. Sterile prep and drape,hand hygiene and sterile gloves were used. Relevant anatomy identified.Needle position confirmed.Local anesthetic injected incrementally after negative aspiration. Local anesthetic spread visualized around nerve(s). Vascular puncture avoided. No complications. Image printed for medical record.The patient tolerated the procedure well.

## 2016-05-28 NOTE — Progress Notes (Signed)
Assisted Dr. Ossey with left, ultrasound guided, supraclavicular block. Side rails up, monitors on throughout procedure. See vital signs in flow sheet. Tolerated Procedure well. 

## 2016-05-28 NOTE — Op Note (Signed)
I assisted Surgeon(s) and Role:    * Leanora Cover, MD - Primary    * Daryll Brod, MD - Assisting on the Procedure(s): LEFT SMALL TENOLYSIS POSSIBLE TENDON GRAFT on 05/28/2016.  I provided assistance on this case as follows: setup, dissection, identification of the tendons, tenolysis, closure, and application of the dressings and splint. I was present for the whole case.  Electronically signed by: Wynonia Sours, MD Date: 05/28/2016 Time: 4:35 PM

## 2016-05-28 NOTE — H&P (Signed)
Shannon Dean is an 41 y.o. female.   Chief Complaint: left small finger stiffness HPI: 41 yo female 6 months s/p left small finger flexor tendon repair.  She has had stiffness of the small finger with minimal improvement in her motion with therapy.  She wishes to undergo tenolysis to try to improve her flexion.  Allergies: Not on File  Past Medical History:  Diagnosis Date  . Migraines   . PONV (postoperative nausea and vomiting)   . Tendon adhesions 05/2016   left small finger - adhesions vs. rupture    Past Surgical History:  Procedure Laterality Date  . TENDON REPAIR Left 12/13/2015   Procedure: Left small finger tendon repair, flexor;  Surgeon: Leanora Cover, MD;  Location: [facility];  Service: Orthopedics;  Laterality: Left;    Family History: Family History  Problem Relation Age of Onset  . Hypertension Mother     Social History:   reports that she has never smoked. She has never used smokeless tobacco. She reports that she does not drink alcohol or use drugs.  Medications: No prescriptions prior to admission.    No results found for this or any previous visit (from the past 48 hour(s)).  No results found.   A comprehensive review of systems was negative.  Blood pressure (!) 116/40, pulse 63, temperature 98.5 F (36.9 C), temperature source Oral, resp. rate 18, height 5' (1.524 m), weight 67.6 kg (149 lb), last menstrual period 05/07/2016, SpO2 100 %.  General appearance: alert, cooperative and appears stated age Head: Normocephalic, without obvious abnormality, atraumatic Neck: supple, symmetrical, trachea midline Resp: clear to auscultation bilaterally Cardio: regular rate and rhythm GI: non-tender Extremities: Intact sensation and capillary refill all digits.  +epl/fpl/io.  No wounds.  Pulses: 2+ and symmetric Skin: Skin color, texture, turgor normal. No rashes or lesions Neurologic: Grossly  normal Incision/Wound:none  Assessment/Plan Left small finger stiffness s/p flexor tendon repair.  Plan tenolysis with possible need for flexor tendon reconstruction either single or two stage.  Non operative and operative treatment options were discussed with the patient and patient wishes to proceed with operative treatment. Risks, benefits, and alternatives of surgery were discussed and the patient agrees with the plan of care.   Shannon Dean 05/28/2016, 1:59 PM

## 2016-05-28 NOTE — Anesthesia Preprocedure Evaluation (Signed)
Anesthesia Evaluation  Patient identified by MRN, date of birth, ID band Patient awake    Reviewed: Allergy & Precautions, NPO status , Patient's Chart, lab work & pertinent test results  History of Anesthesia Complications (+) PONV  Airway Mallampati: I  TM Distance: >3 FB Neck ROM: Full    Dental   Pulmonary    Pulmonary exam normal        Cardiovascular Normal cardiovascular exam     Neuro/Psych    GI/Hepatic   Endo/Other    Renal/GU      Musculoskeletal   Abdominal   Peds  Hematology   Anesthesia Other Findings   Reproductive/Obstetrics                             Anesthesia Physical Anesthesia Plan  ASA: II  Anesthesia Plan: Regional and MAC   Post-op Pain Management:    Induction: Intravenous  Airway Management Planned:   Additional Equipment:   Intra-op Plan:   Post-operative Plan:   Informed Consent: I have reviewed the patients History and Physical, chart, labs and discussed the procedure including the risks, benefits and alternatives for the proposed anesthesia with the patient or authorized representative who has indicated his/her understanding and acceptance.     Plan Discussed with: CRNA and Surgeon  Anesthesia Plan Comments:         Anesthesia Quick Evaluation

## 2016-05-28 NOTE — Op Note (Signed)
[id number] 

## 2016-05-28 NOTE — Brief Op Note (Signed)
05/28/2016  4:42 PM  PATIENT:  Karrah S Dunagan  41 y.o. female  PRE-OPERATIVE DIAGNOSIS:  Left small tendon adhesions vs rupture  M77.9  POST-OPERATIVE DIAGNOSIS:  Left small tendon adhesions vs rupture  PROCEDURE:  Procedure(s): LEFT SMALL TENOLYSIS POSSIBLE TENDON GRAFT (Left)  SURGEON:  Surgeon(s) and Role:    * Leanora Cover, MD - Primary    * Daryll Brod, MD - Assisting  PHYSICIAN ASSISTANT:   ASSISTANTS: Daryll Brod, MD   ANESTHESIA:   regional and MAC  EBL:  Total I/O In: 900 [I.V.:900] Out: 5 [Blood:5]  BLOOD ADMINISTERED:none  DRAINS: none   LOCAL MEDICATIONS USED:  NONE  SPECIMEN:  No Specimen  DISPOSITION OF SPECIMEN:  N/A  COUNTS:  YES  TOURNIQUET:   Total Tourniquet Time Documented: Upper Arm (Left) - 59 minutes Total: Upper Arm (Left) - 59 minutes   DICTATION: .Other Dictation: Dictation Number [id number]  PLAN OF CARE: Discharge to home after PACU  PATIENT DISPOSITION:  PACU - hemodynamically stable.

## 2016-05-29 ENCOUNTER — Encounter (HOSPITAL_BASED_OUTPATIENT_CLINIC_OR_DEPARTMENT_OTHER): Payer: Self-pay | Admitting: Orthopedic Surgery

## 2016-06-01 DIAGNOSIS — M25649 Stiffness of unspecified hand, not elsewhere classified: Secondary | ICD-10-CM | POA: Insufficient documentation

## 2018-04-29 IMAGING — US US EXTREM UP*L* LTD
1 series · 10 of 10 positions shown · non-contrast
Comparison: None

CLINICAL DATA: Laceration of the flexor muscles, fascia and tendon
the left little finger with subsequent surgical repair.

EXAM:
ULTRASOUND LEFT UPPER EXTREMITY LIMITED
TECHNIQUE: Ultrasound examination of the upper extremity soft tissues was
performed in the area of clinical concern.

[Series 1: us extrem up*left* ltd · 10 acquisitions, 10 frames shown]
[im 1/10]
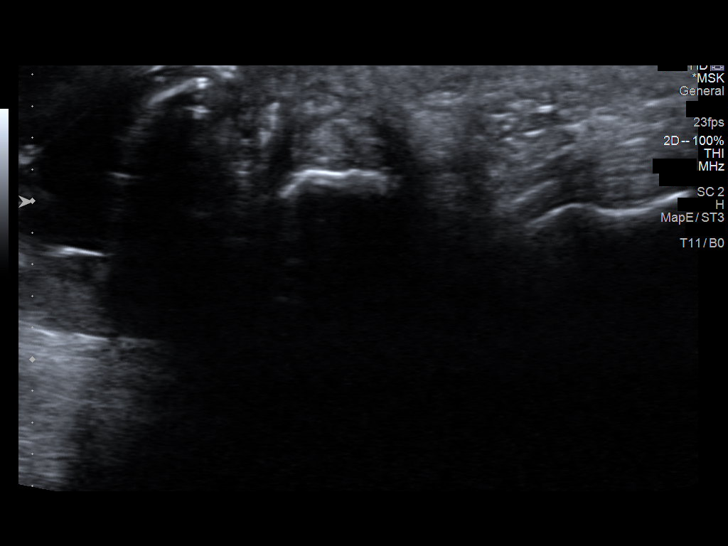
[im 2/10]
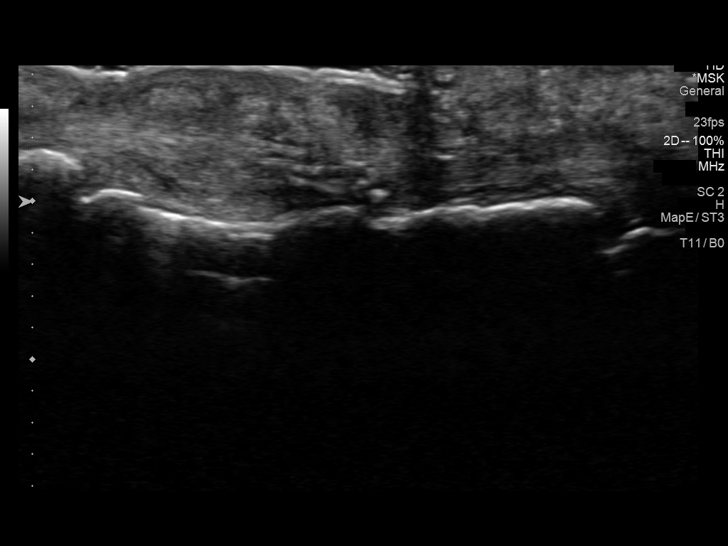
[im 3/10]
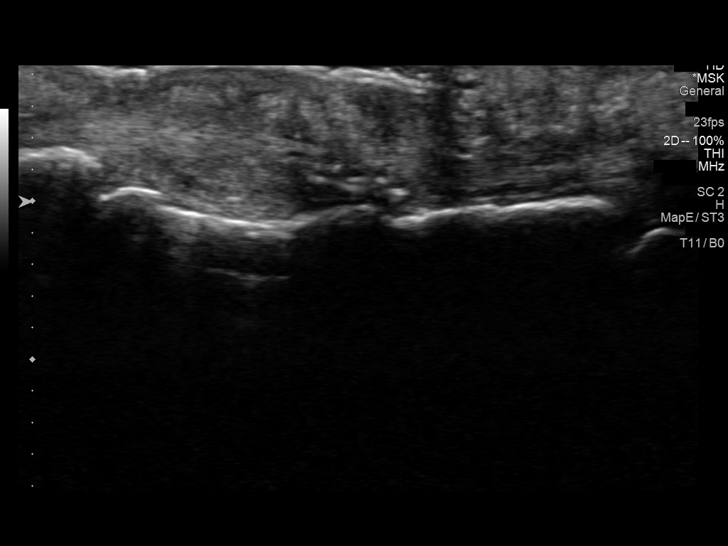
[im 4/10]
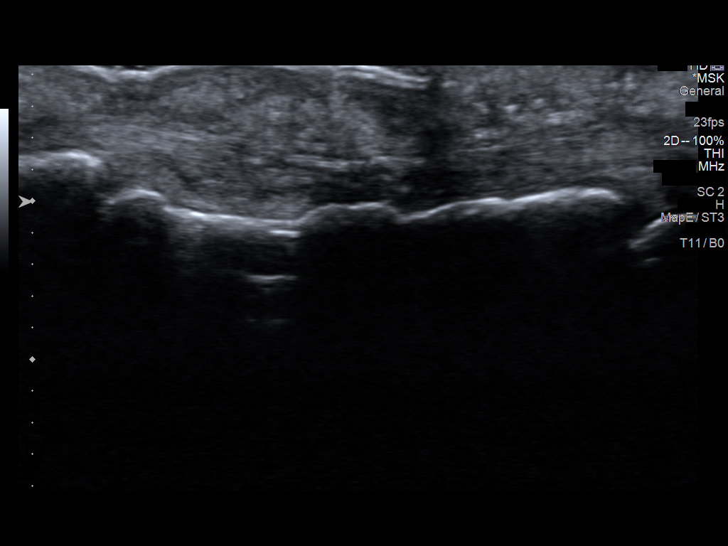
[im 5/10]
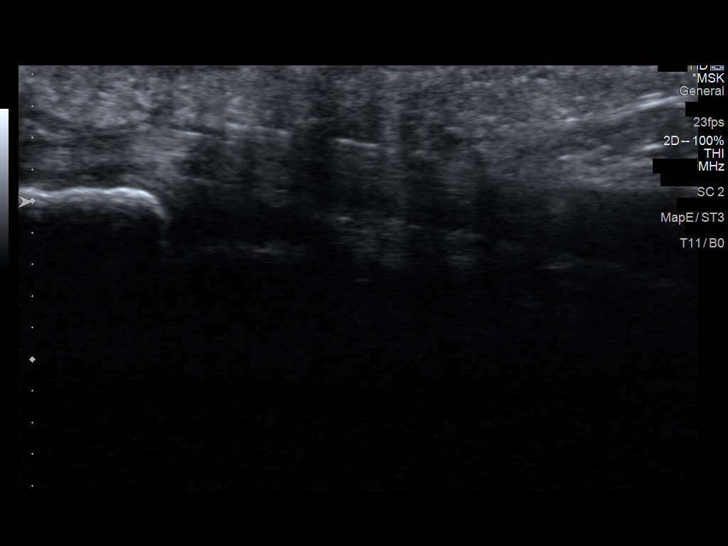
[im 6/10]
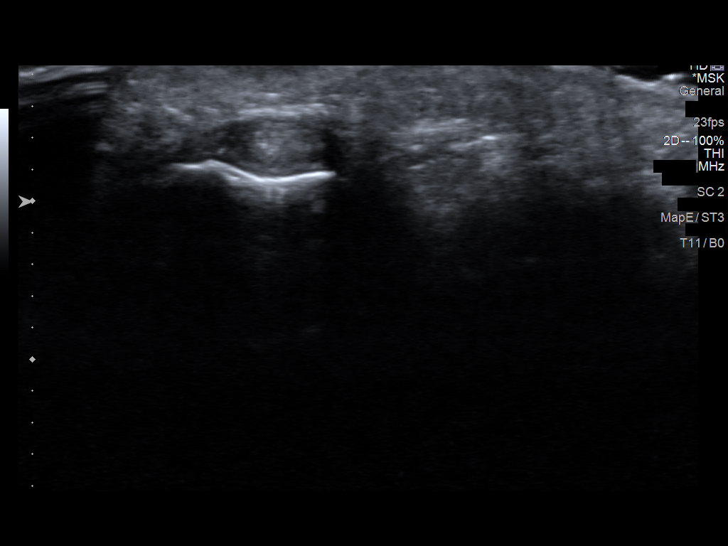
[im 7/10]
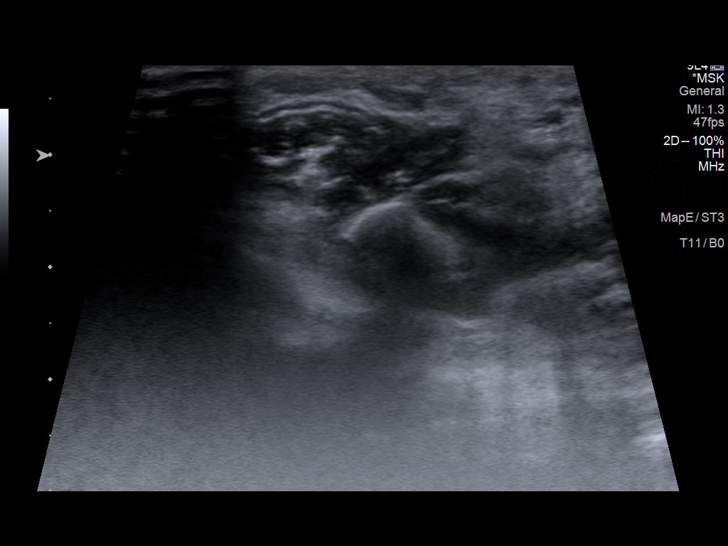
[im 8/10]
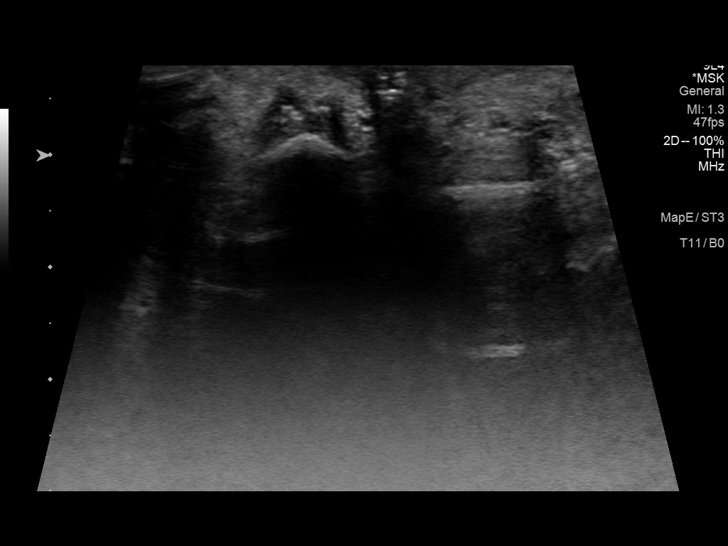
[im 9/10]
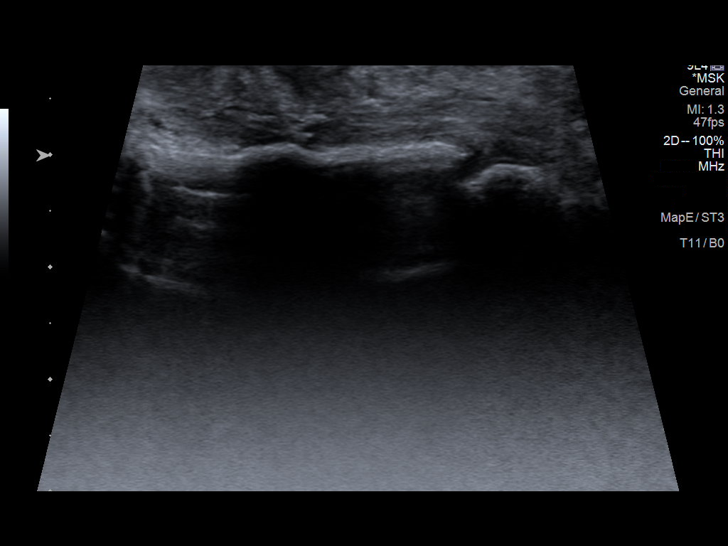
[im 10/10]
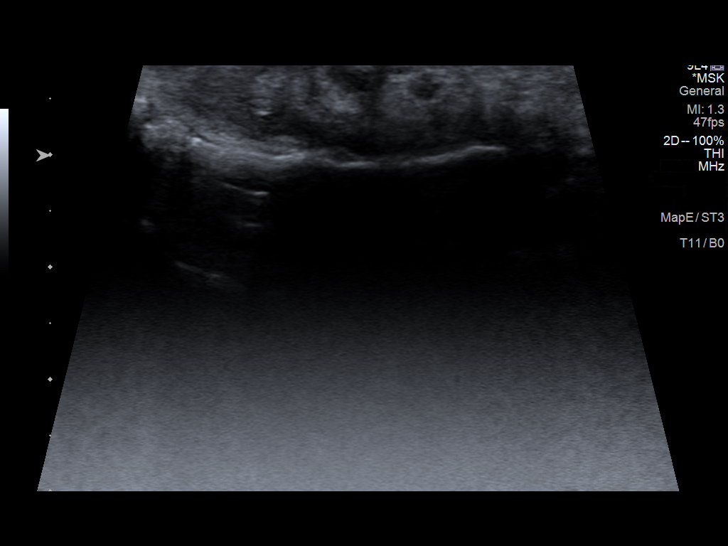

[10 of 10 positions shown; findings below may reference images not displayed]

FINDINGS: The flexor tendons of the little finger are well visualized with 18
and 9 megahertz transducers.

The site of surgical repair of the level metacarpal phalangeal joint
is well visualized. Sutures are noted in the tendons. The tendons
are attenuated at that level but there are some intact fibers that
loss sutures place.

Realtime ultrasound demonstrates no movement of tendon at the distal
to that level but there is some movement of tendon proximal to that
level with limited flexion. This is best seen on the second to last
video clip.

No other abnormalities.
IMPRESSION: 1. Findings consistent with adhesions the level surgical repair
flexor tendons of the little finger.
2. Surgical repair is intact although the tendon is attenuated at
the site of the repair.

## 2018-07-05 ENCOUNTER — Other Ambulatory Visit: Payer: Self-pay

## 2018-07-05 ENCOUNTER — Encounter (HOSPITAL_COMMUNITY): Payer: Self-pay

## 2018-07-05 ENCOUNTER — Emergency Department (HOSPITAL_COMMUNITY)
Admission: EM | Admit: 2018-07-05 | Discharge: 2018-07-05 | Disposition: A | Discharge status: Home / Self Care | Payer: BC Managed Care – PPO | Attending: Emergency Medicine | Admitting: Emergency Medicine

## 2018-07-05 DIAGNOSIS — M62838 Other muscle spasm: Secondary | ICD-10-CM | POA: Insufficient documentation

## 2018-07-05 DIAGNOSIS — M542 Cervicalgia: Secondary | ICD-10-CM | POA: Diagnosis present

## 2018-07-05 DIAGNOSIS — R51 Headache: Secondary | ICD-10-CM | POA: Diagnosis not present

## 2018-07-05 MED ORDER — METHOCARBAMOL 500 MG PO TABS
500.0000 mg | ORAL_TABLET | Freq: Once | ORAL | Status: AC
  Administered 2018-07-05: 500 mg via ORAL
  Filled 2018-07-05: qty 1

## 2018-07-05 MED ORDER — NAPROXEN 250 MG PO TABS
500.0000 mg | ORAL_TABLET | Freq: Once | ORAL | Status: AC
  Administered 2018-07-05: 500 mg via ORAL
  Filled 2018-07-05: qty 2

## 2018-07-05 MED ORDER — METHOCARBAMOL 500 MG PO TABS: 500.0000 mg | ORAL_TABLET | Freq: Two times a day (BID) | ORAL | 0 refills | Status: DC

## 2018-07-05 MED ORDER — NAPROXEN 500 MG PO TABS: 500.0000 mg | ORAL_TABLET | Freq: Two times a day (BID) | ORAL | 0 refills | Status: DC
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
## 2018-07-05 NOTE — Discharge Instructions (Signed)
Take the prescribed medication as directed-- have been sent to pharmacy for you. Can also use warm compress, heating pad, etc to help with muscle tension. Follow-up with your primary care doctor. Return to the ED for new or worsening symptoms.
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
## 2019-07-03 ENCOUNTER — Other Ambulatory Visit: Payer: Self-pay

## 2019-07-03 ENCOUNTER — Ambulatory Visit (INDEPENDENT_AMBULATORY_CARE_PROVIDER_SITE_OTHER): Payer: BLUE CROSS/BLUE SHIELD | Admitting: Podiatry

## 2019-07-03 ENCOUNTER — Ambulatory Visit (INDEPENDENT_AMBULATORY_CARE_PROVIDER_SITE_OTHER): Payer: BLUE CROSS/BLUE SHIELD

## 2019-07-03 DIAGNOSIS — M205X1 Other deformities of toe(s) (acquired), right foot: Secondary | ICD-10-CM | POA: Diagnosis not present

## 2019-07-03 DIAGNOSIS — M7751 Other enthesopathy of right foot: Secondary | ICD-10-CM

## 2019-07-03 DIAGNOSIS — M779 Enthesopathy, unspecified: Secondary | ICD-10-CM

## 2019-07-03 MED ORDER — MELOXICAM 15 MG PO TABS: 15.0000 mg | ORAL_TABLET | Freq: Every day | ORAL | 0 refills | Status: DC

## 2019-07-03 MED ORDER — DICLOFENAC SODIUM 1 % EX GEL: 2.0000 g | Freq: Four times a day (QID) | CUTANEOUS | 0 refills | Status: DC

## 2019-07-03 NOTE — Progress Notes (Signed)
Subjective:   Patient ID: Shannon Dean, female   DOB: 44 y.o.   MRN: [id number]   HPI 44 year old female presents the office today for concerns of pain to her right big toe.  This is been ongoing for quite some time.  She states is worse with wearing high-heeled shoes.  Also she was advised to palpate swelling to the big toe joint.  No recent injury.  No treatment other than over-the-counter anti-inflammatories intermittently.  Review of Systems  All other systems reviewed and are negative.  Past Medical History:  Diagnosis Date  . Migraines   . PONV (postoperative nausea and vomiting)   . Tendon adhesions 05/2016   left small finger - adhesions vs. rupture    Past Surgical History:  Procedure Laterality Date  . TENDON REPAIR Left 12/13/2015   Procedure: Left small finger tendon repair, flexor;  Surgeon: Leanora Cover, MD;  Location: [facility];  Service: Orthopedics;  Laterality: Left;  . TENOLYSIS Left 05/28/2016   Procedure: LEFT SMALL TENOLYSIS;  Surgeon: Leanora Cover, MD;  Location: [facility];  Service: Orthopedics;  Laterality: Left;     Current Outpatient Medications:  .  diclofenac Sodium (VOLTAREN) 1 % GEL, Apply 2 g topically 4 (four) times daily. Rub into affected area of foot 2 to 4 times daily, Disp: 100 g, Rfl: 0 .  meloxicam (MOBIC) 15 MG tablet, Take 1 tablet (15 mg total) by mouth daily., Disp: 30 tablet, Rfl: 0  No Known Allergies       Objective:  Physical Exam  General: AAO x3, NAD  Dermatological: Skin is warm, dry and supple bilateral. Nails x 10 are well manicured; remaining integument appears unremarkable at this time. There are no open sores, no preulcerative lesions, no rash or signs of infection present.  Vascular: Dorsalis Pedis artery and Posterior Tibial artery pedal pulses are 2/4 bilateral with immedate capillary fill time. Pedal hair growth present. No varicosities and no lower extremity edema present bilateral.  There is no pain with calf compression, swelling, warmth, erythema.   Neruologic: Grossly intact via light touch bilateral.   Musculoskeletal: Tenderness palpation of the dorsal aspect of the right first MPJ.  There is tenderness at end range of motion of the first MPJ dorsiflexion.  There is no crepitation.  Minimal edema.  No erythema or warmth.  No erythema discomfort.  Muscular strength 5/5 in all groups tested bilateral.  Gait: Unassisted, Nonantalgic.       Assessment:   Right first digit capsulitis, hallux limitus    Plan:  -Treatment options discussed including all alternatives, risks, and complications -Etiology of symptoms were discussed -X-rays were obtained and reviewed with the patient. Mild elevation of the first ray with elongation of the first metatarsal.  No evidence of acute fracture. -She has Voltaren gel as needed or meloxicam.  Discussed shoe modifications.  Also discussed orthotics will check insurance coverage for her.  Offered steroid injection helpful.  Also long-term consider surgical intervention if needed.  She was to hold off on that as well.  Trula Slade DPM

## 2019-07-03 NOTE — Patient Instructions (Signed)
Use either the Voltaren gel or the meloxicam

## 2019-07-07 ENCOUNTER — Telehealth: Payer: Self-pay | Admitting: *Deleted

## 2019-07-07 NOTE — Telephone Encounter (Signed)
Called and spoke with the patient and stated that the voltaren gel can be picked up over the counter through the pharmacy. Shannon Dean

## 2019-08-04 ENCOUNTER — Other Ambulatory Visit: Payer: Self-pay | Admitting: Podiatry

## 2019-08-11 ENCOUNTER — Telehealth: Payer: Self-pay | Admitting: *Deleted

## 2019-08-11 NOTE — Telephone Encounter (Signed)
Patient needed a refill of the mobic and per Dr Jacqualyn Posey refilled the prescription at [address]-mobic 15 mg 30 tablets with one refill. Shannon Dean

## 2020-03-21 DIAGNOSIS — M79672 Pain in left foot: Secondary | ICD-10-CM | POA: Insufficient documentation

## 2020-05-01 ENCOUNTER — Ambulatory Visit
Admission: EM | Admit: 2020-05-01 | Discharge: 2020-05-01 | Disposition: A | Discharge status: Home / Self Care | Payer: Medicaid Other | Attending: Family Medicine | Admitting: Family Medicine

## 2020-05-01 ENCOUNTER — Other Ambulatory Visit: Payer: Self-pay

## 2020-05-01 DIAGNOSIS — N390 Urinary tract infection, site not specified: Secondary | ICD-10-CM | POA: Insufficient documentation

## 2020-05-01 LAB — POCT URINALYSIS DIP (MANUAL ENTRY)
Bilirubin, UA: NEGATIVE
Glucose, UA: NEGATIVE mg/dL
Ketones, POC UA: NEGATIVE mg/dL
Nitrite, UA: POSITIVE — AB
Protein Ur, POC: 100 mg/dL — AB
Spec Grav, UA: 1.025 (ref 1.010–1.025)
Urobilinogen, UA: 0.2 E.U./dL
pH, UA: 8.5 — AB (ref 5.0–8.0)

## 2020-05-01 MED ORDER — SULFAMETHOXAZOLE-TRIMETHOPRIM 800-160 MG PO TABS: 1.0000 | ORAL_TABLET | Freq: Two times a day (BID) | ORAL | 0 refills | Status: DC

## 2020-05-01 NOTE — ED Triage Notes (Signed)
Pt presents with low back pain and some dysuria for past couple of days

## 2020-05-01 NOTE — ED Provider Notes (Signed)
[facility]    CSN: [id number] Arrival date & time: 05/01/20  1648      History   Chief Complaint No chief complaint on file.   HPI Shannon Dean is a 45 y.o. female.   Patient presenting today with about a week of persistent dysuria, bilateral low back pain, nausea this morning.  She states she has had urinary tract infections before and this feels consistent.  She denies hematuria, fever, chills, vaginal symptoms, concern for STDs or pregnancy.  LMP was 04/05/2020.  Has been taking Azo as needed with mild relief     Past Medical History:  Diagnosis Date  . Migraines   . PONV (postoperative nausea and vomiting)   . Tendon adhesions 05/2016   left small finger - adhesions vs. rupture    There are no problems to display for this patient.   Past Surgical History:  Procedure Laterality Date  . TENDON REPAIR Left 12/13/2015   Procedure: Left small finger tendon repair, flexor;  Surgeon: Leanora Cover, MD;  Location: [place];  Service: Orthopedics;  Laterality: Left;  . TENOLYSIS Left 05/28/2016   Procedure: LEFT SMALL TENOLYSIS;  Surgeon: Leanora Cover, MD;  Location: [place];  Service: Orthopedics;  Laterality: Left;    OB History   No obstetric history on file.      Home Medications    Prior to Admission medications   Medication Sig Start Date End Date Taking? Authorizing Provider  sulfamethoxazole-trimethoprim (BACTRIM DS) 800-160 MG tablet Take 1 tablet by mouth 2 (two) times daily. 05/01/20  Yes Volney American, PA-C  diclofenac Sodium (VOLTAREN) 1 % GEL Apply 2 g topically 4 (four) times daily. Rub into affected area of foot 2 to 4 times daily 07/03/19   Trula Slade, DPM  meloxicam (MOBIC) 15 MG tablet TAKE 1 TABLET BY MOUTH EVERY DAY 08/04/19   Trula Slade, DPM    Family History Family History  Problem Relation Age of Onset  . Hypertension Mother     Social History Social History   Tobacco  Use  . Smoking status: Never Smoker  . Smokeless tobacco: Never Used  Substance Use Topics  . Alcohol use: No    Alcohol/week: 0.0 standard drinks  . Drug use: No     Allergies   Patient has no known allergies.   Review of Systems Review of Systems Per HPI  Physical Exam Triage Vital Signs ED Triage Vitals  Enc Vitals Group     BP 05/01/20 1704 133/77     Pulse Rate 05/01/20 1704 75     Resp 05/01/20 1704 20     Temp 05/01/20 1704 98.4 F (36.9 C)     Temp Source 05/01/20 1704 Oral     SpO2 05/01/20 1704 97 %     Weight --      Height --      Head Circumference --      Peak Flow --      Pain Score 05/01/20 1712 4     Pain Loc --      Pain Edu? --      Excl. in [place]? --    No data found.  Updated Vital Signs BP 133/77 (BP Location: Left Arm)   Pulse 75   Temp 98.4 F (36.9 C) (Oral)   Resp 20   SpO2 97%   Visual Acuity Right Eye Distance:   Left Eye Distance:   Bilateral Distance:  Right Eye Near:   Left Eye Near:    Bilateral Near:     Physical Exam Vitals and nursing note reviewed.  Constitutional:      Appearance: Normal appearance. She is not ill-appearing.  HENT:     Head: Atraumatic.     Mouth/Throat:     Mouth: Mucous membranes are moist.     Pharynx: Oropharynx is clear.  Eyes:     Extraocular Movements: Extraocular movements intact.     Conjunctiva/sclera: Conjunctivae normal.  Cardiovascular:     Rate and Rhythm: Normal rate and regular rhythm.     Heart sounds: Normal heart sounds.  Pulmonary:     Effort: Pulmonary effort is normal.     Breath sounds: Normal breath sounds.  Abdominal:     General: Bowel sounds are normal. There is no distension.     Palpations: Abdomen is soft.     Tenderness: There is no abdominal tenderness. There is no right CVA tenderness, left CVA tenderness or guarding.  Musculoskeletal:        General: Normal range of motion.     Cervical back: Normal range of motion and neck supple.  Skin:     General: Skin is warm and dry.  Neurological:     Mental Status: She is alert and oriented to person, place, and time.  Psychiatric:        Mood and Affect: Mood normal.        Thought Content: Thought content normal.        Judgment: Judgment normal.      UC Treatments / Results  Labs (all labs ordered are listed, but only abnormal results are displayed) Labs Reviewed  POCT URINALYSIS DIP (MANUAL ENTRY) - Abnormal; Notable for the following components:      Result Value   Clarity, UA cloudy (*)    Blood, UA large (*)    pH, UA 8.5 (*)    Protein Ur, POC =100 (*)    Nitrite, UA Positive (*)    Leukocytes, UA Small (1+) (*)    All other components within normal limits  URINE CULTURE    EKG   Radiology No results found.  Procedures Procedures (including critical care time)  Medications Ordered in UC Medications - No data to display  Initial Impression / Assessment and Plan / UC Course  I have reviewed the triage vital signs and the nursing notes.  Pertinent labs & imaging results that were available during my care of the patient were reviewed by me and considered in my medical decision making (see chart for details).     Vitals and exam reassuring, UA showing urinary tract infection.  Urine culture pending.  Will treat with Bactrim, fluids, Azo as needed.  Await culture and make additional changes as needed.  Return precautions given.  Final Clinical Impressions(s) / UC Diagnoses   Final diagnoses:  Acute lower UTI   Discharge Instructions   None    ED Prescriptions    Medication Sig Dispense Auth. Provider   sulfamethoxazole-trimethoprim (BACTRIM DS) 800-160 MG tablet Take 1 tablet by mouth 2 (two) times daily. 6 tablet Volney American, [place]     PDMP not reviewed this encounter.   Volney American, [place] 05/01/20 1801

## 2020-05-04 LAB — URINE CULTURE

## 2020-05-05 LAB — URINE CULTURE: Culture: >=100000 — AB

## 2020-05-06 ENCOUNTER — Telehealth (HOSPITAL_COMMUNITY): Payer: Self-pay | Admitting: Emergency Medicine

## 2020-05-06 MED ORDER — NITROFURANTOIN MONOHYD MACRO 100 MG PO CAPS: 100.0000 mg | ORAL_CAPSULE | Freq: Two times a day (BID) | ORAL | 0 refills | Status: DC

## 2021-01-02 HISTORY — PX: CERVICAL BIOPSY: SHX590

## 2021-02-16 DIAGNOSIS — C801 Malignant (primary) neoplasm, unspecified: Secondary | ICD-10-CM

## 2021-02-16 HISTORY — DX: Malignant (primary) neoplasm, unspecified: C80.1

## 2021-04-23 ENCOUNTER — Encounter (HOSPITAL_BASED_OUTPATIENT_CLINIC_OR_DEPARTMENT_OTHER): Payer: Self-pay | Admitting: Obstetrics and Gynecology

## 2021-04-23 ENCOUNTER — Other Ambulatory Visit: Payer: Self-pay

## 2021-04-23 DIAGNOSIS — Z01812 Encounter for preprocedural laboratory examination: Secondary | ICD-10-CM | POA: Diagnosis present

## 2021-04-23 DIAGNOSIS — C539 Malignant neoplasm of cervix uteri, unspecified: Secondary | ICD-10-CM | POA: Diagnosis not present

## 2021-04-23 NOTE — Progress Notes (Signed)
?PLEASE WEAR A MASK OUT IN PUBLIC AND SOCIAL DISTANCE AND [place] YOUR HANDS FREQUENTLY. PLEASE ASK ALL YOUR CLOSE HOUSEHOLD CONTACT TO WEAR MASK OUT IN PUBLIC AND SOCIAL DISTANCE AND [place] HANDS FREQUENTLY ALSO. ? ? ? ? ? Your procedure is scheduled on Wednesday, 04/30/21. ? Report to [place] AT  6:30 am. ? ? Call this number if you have problems the morning of surgery  :[phone]. ? ? [place].  WE ARE LOCATED IN THE [facility]. ? ?PLEASE BRING YOUR INSURANCE CARD AND PHOTO ID DAY OF SURGERY. ? ?ONLY ONE PERSON ALLOWED IN FACILITY WAITING AREA. ?                                   ? REMEMBER: ? DO NOT EAT FOOD, CANDY GUM OR MINTS  AFTER MIDNIGHT THE NIGHT BEFORE YOUR SURGERY . YOU MAY HAVE CLEAR LIQUIDS FROM MIDNIGHT THE NIGHT BEFORE YOUR SURGERY UNTIL  5:30 am. NO CLEAR LIQUIDS AFTER   _5:30 am DAY OF SURGERY. ? ? YOU MAY  BRUSH YOUR TEETH MORNING OF SURGERY AND RINSE YOUR MOUTH OUT, NO CHEWING GUM CANDY OR MINTS. ? ? ? ?CLEAR LIQUID DIET ? ? ?Foods Allowed                                                                     Foods Excluded ? ?Coffee and tea, regular and decaf                             liquids that you cannot  ?Plain Jell-O any favor except red or purple                                           see through such as: ?Fruit ices (not with fruit pulp)                                     milk, soups, orange juice  ?Iced Popsicles                                    All solid food ?Carbonated beverages, regular and diet                                    ?Cranberry, grape and apple juices ?Sports drinks like Gatorade ? ?Sample Menu ?Breakfast                                Lunch  Supper ?Cranberry juice                                           ?Jell-O                                     Grape juice                           Apple juice ?Coffee or tea                        Jell-O                                       Popsicle ?                                               Coffee or tea                        Coffee or tea ? ?_____________________________________________________________________ ?  ? ? TAKE THESE MEDICATIONS MORNING OF SURGERY WITH A SIP OF WATER:  NONE ? ?ONE VISITOR IS ALLOWED IN WAITING ROOM ONLY DAY OF SURGERY.  YOU MAY HAVE ANOTHER PERSON SWITCH OUT WITH THE  1  VISITOR IN THE WAITING ROOM DAY OF SURGERY AND A MASK MUST BE WORN IN THE WAITING ROOM.  ? ? 2 VISITORS  MAY VISIT IN THE EXTENDED RECOVERY ROOM UNTIL 800 PM ONLY 1 VISITOR AGE 48 AND OVER MAY SPEND THE NIGHT AND MUST BE IN EXTENDED RECOVERY ROOM NO LATER THAN 800 PM .  ? ? UP TO 2 CHILDREN AGE 81 TO 15 MAY ALSO VISIT IN EXTENDED RECOV ?ERY ROOM ONLY UNTIL 800 PM AND MUST LEAVE BY 800 PM.  ? ?ALL PERSONS VISITING IN EXTENDED RECOVERY ROOM MUST WEAR A MASK. ?                                   ?DO NOT WEAR JEWERLY, MAKE UP. ?DO NOT WEAR LOTIONS, POWDERS, PERFUMES OR NAIL POLISH ON YOUR FINGERNAILS. TOENAIL POLISH IS OK TO WEAR. ?DO NOT SHAVE FOR 48 HOURS PRIOR TO DAY OF SURGERY. ?MEN MAY SHAVE FACE AND NECK. ?CONTACTS, GLASSES, OR DENTURES MAY NOT BE WORN TO SURGERY. ?                                   ?[facility] IS NOT RESPONSIBLE  FOR ANY BELONGINGS.                                  ?                                  . ?  [place] - Preparing for Surgery ?Before surgery, you can play an important role.  Because skin is not sterile, your skin needs to be as free of germs as possible.  You can reduce the number of germs on your skin by washing with CHG (chlorahexidine gluconate) soap before surgery.  CHG is an antiseptic cleaner which kills germs and bonds with the skin to continue killing germs even after washing. ?Please DO NOT use if you have an allergy to CHG or antibacterial soaps.  If your skin becomes reddened/irritated stop using the CHG and inform your nurse when you arrive at Short Stay. ?Do not shave (including legs and  underarms) for at least 48 hours prior to the first CHG shower.  You may shave your face/neck. ?Please follow these instructions carefully: ? 1.  Shower with CHG Soap the night before surgery and the  morning of Surgery. ? 2.  If you choose to wash your hair, wash your hair first as usual with your  normal  shampoo. ? 3.  After you shampoo, rinse your hair and body thoroughly to remove the  shampoo.                            ?4.  Use CHG as you would any other liquid soap.  You can apply chg directly  to the skin and wash  ?                    Gently with a scrungie or clean washcloth. ? 5.  Apply the CHG Soap to your body ONLY FROM THE NECK DOWN.   Do not use on face/ open      ?                     Wound or open sores. Avoid contact with eyes, ears mouth and genitals (private parts).  ?                     Production manager,  Genitals (private parts) with your normal soap. ?            6.  Wash thoroughly, paying special attention to the area where your surgery  will be performed. ? 7.  Thoroughly rinse your body with warm water from the neck down. ? 8.  DO NOT shower/wash with your normal soap after using and rinsing off  the CHG Soap. ?               9.  Pat yourself dry with a clean towel. ?           10.  Wear clean pajamas. ?           11.  Place clean sheets on your bed the night of your first shower and do not  sleep with pets. ?Day of Surgery : ?Do not apply any lotions/deodorants the morning of surgery.  Please wear clean clothes to the hospital/surgery center. ? ?IF YOU HAVE ANY SKIN IRRITATION OR PROBLEMS WITH THE SURGICAL SOAP, PLEASE GET A BAR OF GOLD DIAL SOAP AND SHOWER THE NIGHT BEFORE YOUR SURGERY AND THE MORNING OF YOUR SURGERY. PLEASE LET THE NURSE KNOW MORNING OF YOUR SURGERY IF YOU HAD ANY PROBLEMS WITH THE SURGICAL SOAP. ? ? ?________________________________________________________________________                  ?                                    ?  QUESTIONS [place] [facility] PRE OP NURSE PHONE  [phone]                                    ?

## 2021-04-23 NOTE — Progress Notes (Addendum)
Spoke w/ via phone for pre-op interview---Leva ?Lab needs dos---- urine pregnancy POCT            ?Lab results------04/28/20 lab appt for CBC, type & screen ?COVID test -----patient states asymptomatic no test needed ?Arrive at -------0630 on Wednesday, 04/30/21 ?NPO after MN NO Solid Food.  Clear liquids from MN until---0530 ?Med rec completed ?Medications to take morning of surgery -----none ?Diabetic medication -----n/a ?Patient instructed no nail polish to be worn day of surgery ?Patient instructed to bring photo id and insurance card day of surgery ?Patient aware to have Driver (ride ) / caregiver    for 24 hours after surgery-husband ?Patient Special Instructions -----Extended recovery instructions given. ?Pre-Op special Istructions -----none ?Patient verbalized understanding of instructions that were given at this phone interview. ?Patient denies shortness of breath, chest pain, fever, cough at this phone interview.  ? ? ?Per 04/23/2021 pre-op phone call with patient, she stated that she was fasting and drinking only liquids from 3/4 thru 04/26/21 for religious reasons. I instructed her to call her surgeon's office and talk to her doctor about whether she should be fasting that close to having surgery. Patient was agreeable. ?

## 2021-04-24 NOTE — Progress Notes (Signed)
I sent an Epic IB message to Dr. Willis Modena letting him know that the patient had told me she was fasting from 04/19/21 to 04/26/21. Her hysterectomy is scheduled for 04/30/21. I instructed patient to call his office. But I just wanted to make him aware in case there were any concerns about this or the patient did not make him aware. ?

## 2021-04-28 ENCOUNTER — Encounter (HOSPITAL_COMMUNITY)
Admission: RE | Admit: 2021-04-28 | Discharge: 2021-04-28 | Disposition: A | Discharge status: Home / Self Care | Payer: BC Managed Care – PPO | Source: Ambulatory Visit | Attending: Obstetrics and Gynecology | Admitting: Obstetrics and Gynecology

## 2021-04-28 ENCOUNTER — Other Ambulatory Visit: Payer: Self-pay

## 2021-04-28 DIAGNOSIS — Z01812 Encounter for preprocedural laboratory examination: Secondary | ICD-10-CM | POA: Insufficient documentation

## 2021-04-28 DIAGNOSIS — C539 Malignant neoplasm of cervix uteri, unspecified: Secondary | ICD-10-CM | POA: Insufficient documentation

## 2021-04-28 LAB — CBC
HCT: 39.1 % (ref 36.0–46.0)
Hemoglobin: 12.5 g/dL (ref 12.0–15.0)
MCH: 28.7 pg (ref 26.0–34.0)
MCHC: 32 g/dL (ref 30.0–36.0)
MCV: 89.9 fL (ref 80.0–100.0)
Platelets: 178 10*3/uL (ref 150–400)
RBC: 4.35 MIL/uL (ref 3.87–5.11)
RDW: 13.6 % (ref 11.5–15.5)
WBC: 4.1 10*3/uL (ref 4.0–10.5)
nRBC: 0 % (ref 0.0–0.2)

## 2021-04-29 NOTE — H&P (Signed)
Shannon Dean is an 46 y.o. female. She was seen for annual exam this past October, irregular menses monthly, no problems.  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  LEEP in 01-30-21 with IA1 SCCA cervix.  Options discussed, opting for definitive therapy ? ?Pertinent Gynecological History: ?Last mammogram: normal Date: 11/2020 ?OB History: G4, [id number] ?SVD x 3  ? ?Menstrual History: ?Patient's last menstrual period was 04/07/2021. ?  ? ?Past Medical History:  ?Diagnosis Date  ? Cancer [facility]) 2023  ? cervical  ? Migraines   ? PONV (postoperative nausea and vomiting)   ? Tendon adhesions 05/2016  ? left small finger - adhesions vs. rupture  ? ? ?Past Surgical History:  ?Procedure Laterality Date  ? CERVICAL BIOPSY  01/02/2021  ? TENDON REPAIR Left 12/13/2015  ? Procedure: Left small finger tendon repair, flexor;  Surgeon: Leanora Cover, MD;  Location: [place];  Service: Orthopedics;  Laterality: Left;  ? TENOLYSIS Left 05/28/2016  ? Procedure: LEFT SMALL TENOLYSIS;  Surgeon: Leanora Cover, MD;  Location: [place];  Service: Orthopedics;  Laterality: Left;  ? TUBAL LIGATION  2014  ? Essure tubal ligation  ? ? ?Family History  ?Problem Relation Age of Onset  ? Hypertension Mother   ? ? ?Social History:  reports that she has never smoked. She has never used smokeless tobacco. She reports that she does not drink alcohol and does not use drugs. ? ?Allergies: No Known Allergies ? ?No medications prior to admission.  ? ? ?Review of Systems  ?Respiratory: Negative.    ?Cardiovascular: Negative.   ? ?Height 5' (1.524 m), weight 71.7 kg, last menstrual period 04/07/2021. ?Physical Exam ?Constitutional:   ?   Appearance: Normal appearance.  ?Cardiovascular:  ?   Rate and Rhythm: Normal rate and regular rhythm.  ?   Heart sounds: Normal heart sounds. No murmur heard. ?Pulmonary:  ?   Effort: Pulmonary effort is normal. No respiratory distress.  ?   Breath sounds:  Normal breath sounds.  ?Abdominal:  ?   General: There is no distension.  ?   Palpations: Abdomen is soft. There is no mass.  ?   Tenderness: There is no abdominal tenderness.  ?Genitourinary: ?   General: Normal vulva.  ?   Comments: Uterus normal size but slightly irregular ?No adnexal mass ?Cervix well healed from LEEP ?Musculoskeletal:  ?   Cervical back: Normal range of motion and neck supple.  ?Neurological:  ?   Mental Status: She is alert.  ? ? ?No results found for this or any previous visit (from the past 24 hour(s)). ? ?No results found. ? ?Assessment/Plan: ?Stage IA1 SCCA of cervix, s/p LEEP, being admitted for definitive surgical therapy.  Surgical procedure, risks, alternatives, chances of recurrence all discussed, questions answered.  Will admit for [facility], possible bilateral salpingectomy, cystoscopy ? ?Blane Ohara Hurschel Paynter ?04/29/2021, 3:34 PM ? ?

## 2021-04-29 NOTE — Anesthesia Preprocedure Evaluation (Addendum)
Anesthesia Evaluation  ?Patient identified by MRN, date of birth, ID band ?Patient awake ? ? ? ?Reviewed: ?Allergy & Precautions, NPO status , Patient's Chart, lab work & pertinent test results ? ?History of Anesthesia Complications ?(+) PONV and history of anesthetic complications ? ?Airway ?Mallampati: II ? ?TM Distance: >3 FB ?Neck ROM: Full ? ? ? Dental ?no notable dental hx. ?(+) Teeth Intact, Dental Advisory Given ?  ?Pulmonary ?neg pulmonary ROS,  ?  ?Pulmonary exam normal ?breath sounds clear to auscultation ? ? ? ? ? ? Cardiovascular ?negative cardio ROS ?Normal cardiovascular exam ?Rhythm:Regular Rate:Normal ? ? ?  ?Neuro/Psych ? Headaches,   ? GI/Hepatic ?negative GI ROS, Neg liver ROS,   ?Endo/Other  ?negative endocrine ROS ? Renal/GU ?negative Renal ROS  ? ?  ?Musculoskeletal ?negative musculoskeletal ROS ?(+)  ? Abdominal ?(+) + obese,   ?Peds ? Hematology ?negative hematology ROS ?(+)   ?Anesthesia Other Findings ? ? Reproductive/Obstetrics ?negative OB ROS ? ?  ? ? ? ? ? ? ? ? ? ? ? ? ? ?  ?  ? ? ? ? ? ? ? ?Anesthesia Physical ?Anesthesia Plan ? ?ASA: 2 ? ?Anesthesia Plan: General  ? ?Post-op Pain Management: Tylenol PO (pre-op)* and Toradol IV (intra-op)*  ? ?Induction: Intravenous ? ?PONV Risk Score and Plan: 4 or greater and Ondansetron, Dexamethasone, Treatment may vary due to age or medical condition, Midazolam and Scopolamine patch - Pre-op ? ?Airway Management Planned: LMA and Oral ETT ? ?Additional Equipment:  ? ?Intra-op Plan:  ? ?Post-operative Plan: Extubation in OR ? ?Informed Consent: I have reviewed the patients History and Physical, chart, labs and discussed the procedure including the risks, benefits and alternatives for the proposed anesthesia with the patient or authorized representative who has indicated his/her understanding and acceptance.  ? ? ? ?Dental advisory given ? ?Plan Discussed with: CRNA ? ?Anesthesia Plan Comments:    ? ? ? ? ?Anesthesia Quick Evaluation ? ?

## 2021-04-30 ENCOUNTER — Other Ambulatory Visit: Payer: Self-pay

## 2021-04-30 ENCOUNTER — Encounter (HOSPITAL_BASED_OUTPATIENT_CLINIC_OR_DEPARTMENT_OTHER)
Admission: RE | Disposition: A | Discharge status: Home / Self Care | Payer: Self-pay | Source: Ambulatory Visit | Attending: Obstetrics and Gynecology

## 2021-04-30 ENCOUNTER — Ambulatory Visit (HOSPITAL_BASED_OUTPATIENT_CLINIC_OR_DEPARTMENT_OTHER): Payer: BC Managed Care – PPO | Admitting: Certified Registered Nurse Anesthetist

## 2021-04-30 ENCOUNTER — Encounter (HOSPITAL_BASED_OUTPATIENT_CLINIC_OR_DEPARTMENT_OTHER): Payer: Self-pay | Admitting: Obstetrics and Gynecology

## 2021-04-30 ENCOUNTER — Ambulatory Visit (HOSPITAL_BASED_OUTPATIENT_CLINIC_OR_DEPARTMENT_OTHER)
Admission: RE | Admit: 2021-04-30 | Discharge: 2021-04-30 | Disposition: A | Discharge status: Home / Self Care | Payer: BC Managed Care – PPO | Source: Ambulatory Visit | Attending: Obstetrics and Gynecology | Admitting: Obstetrics and Gynecology

## 2021-04-30 DIAGNOSIS — Z90710 Acquired absence of both cervix and uterus: Secondary | ICD-10-CM | POA: Diagnosis present

## 2021-04-30 DIAGNOSIS — D259 Leiomyoma of uterus, unspecified: Secondary | ICD-10-CM | POA: Diagnosis not present

## 2021-04-30 DIAGNOSIS — D069 Carcinoma in situ of cervix, unspecified: Secondary | ICD-10-CM | POA: Insufficient documentation

## 2021-04-30 DIAGNOSIS — C539 Malignant neoplasm of cervix uteri, unspecified: Secondary | ICD-10-CM | POA: Diagnosis present

## 2021-04-30 DIAGNOSIS — N72 Inflammatory disease of cervix uteri: Secondary | ICD-10-CM | POA: Insufficient documentation

## 2021-04-30 LAB — TYPE AND SCREEN
ABO/RH(D): O POS
Antibody Screen: NEGATIVE

## 2021-04-30 LAB — POCT PREGNANCY, URINE: Preg Test, Ur: NEGATIVE

## 2021-04-30 LAB — ABO/RH: ABO/RH(D): O POS

## 2021-04-30 SURGERY — HYSTERECTOMY, VAGINAL
Anesthesia: General | Site: Vagina

## 2021-04-30 MED ORDER — ROCURONIUM BROMIDE 10 MG/ML (PF) SYRINGE
PREFILLED_SYRINGE | INTRAVENOUS | Status: AC
  Filled 2021-04-30: qty 20

## 2021-04-30 MED ORDER — KETOROLAC TROMETHAMINE 30 MG/ML IJ SOLN
INTRAMUSCULAR | Status: AC
  Filled 2021-04-30: qty 2

## 2021-04-30 MED ORDER — MIDAZOLAM HCL 2 MG/2ML IJ SOLN
INTRAMUSCULAR | Status: AC
  Filled 2021-04-30: qty 2

## 2021-04-30 MED ORDER — IBUPROFEN 600 MG PO TABS: 600.0000 mg | ORAL_TABLET | Freq: Four times a day (QID) | ORAL | 0 refills | Status: AC

## 2021-04-30 MED ORDER — GABAPENTIN 300 MG PO CAPS
300.0000 mg | ORAL_CAPSULE | ORAL | Status: AC
  Administered 2021-04-30: 300 mg via ORAL

## 2021-04-30 MED ORDER — ONDANSETRON HCL 4 MG/2ML IJ SOLN: 4.0000 mg | Freq: Four times a day (QID) | INTRAMUSCULAR | Status: DC | PRN

## 2021-04-30 MED ORDER — SIMETHICONE 80 MG PO CHEW: 80.0000 mg | CHEWABLE_TABLET | Freq: Four times a day (QID) | ORAL | Status: DC | PRN

## 2021-04-30 MED ORDER — FENTANYL CITRATE (PF) 250 MCG/5ML IJ SOLN
INTRAMUSCULAR | Status: AC
  Filled 2021-04-30: qty 5

## 2021-04-30 MED ORDER — CEFAZOLIN SODIUM-DEXTROSE 2-4 GM/100ML-% IV SOLN
2.0000 g | INTRAVENOUS | Status: AC
  Administered 2021-04-30: 2 g via INTRAVENOUS

## 2021-04-30 MED ORDER — FENTANYL CITRATE (PF) 250 MCG/5ML IJ SOLN
INTRAMUSCULAR | Status: DC | PRN
  Administered 2021-04-30 (×2): 50 ug via INTRAVENOUS
  Administered 2021-04-30: 25 ug via INTRAVENOUS
  Administered 2021-04-30: 50 ug via INTRAVENOUS
  Administered 2021-04-30: 25 ug via INTRAVENOUS

## 2021-04-30 MED ORDER — OXYCODONE HCL 5 MG PO TABS: 5.0000 mg | ORAL_TABLET | Freq: Once | ORAL | Status: DC | PRN

## 2021-04-30 MED ORDER — KETOROLAC TROMETHAMINE 30 MG/ML IJ SOLN
INTRAMUSCULAR | Status: AC
  Filled 2021-04-30: qty 1

## 2021-04-30 MED ORDER — ACETAMINOPHEN 500 MG PO TABS
1000.0000 mg | ORAL_TABLET | ORAL | Status: AC
  Administered 2021-04-30: 1000 mg via ORAL

## 2021-04-30 MED ORDER — ONDANSETRON HCL 4 MG/2ML IJ SOLN
INTRAMUSCULAR | Status: AC
  Filled 2021-04-30: qty 2

## 2021-04-30 MED ORDER — OXYCODONE HCL 5 MG PO TABS: 5.0000 mg | ORAL_TABLET | ORAL | 0 refills | Status: AC | PRN

## 2021-04-30 MED ORDER — AMISULPRIDE (ANTIEMETIC) 5 MG/2ML IV SOLN: 10.0000 mg | Freq: Once | INTRAVENOUS | Status: DC | PRN

## 2021-04-30 MED ORDER — PHENYLEPHRINE 40 MCG/ML (10ML) SYRINGE FOR IV PUSH (FOR BLOOD PRESSURE SUPPORT)
PREFILLED_SYRINGE | INTRAVENOUS | Status: AC
  Filled 2021-04-30: qty 10

## 2021-04-30 MED ORDER — LIDOCAINE HCL (PF) 2 % IJ SOLN
INTRAMUSCULAR | Status: AC
  Filled 2021-04-30: qty 15

## 2021-04-30 MED ORDER — ONDANSETRON HCL 4 MG PO TABS: 4.0000 mg | ORAL_TABLET | Freq: Four times a day (QID) | ORAL | Status: DC | PRN

## 2021-04-30 MED ORDER — OXYCODONE HCL 5 MG/5ML PO SOLN: 5.0000 mg | Freq: Once | ORAL | Status: DC | PRN

## 2021-04-30 MED ORDER — HYDROMORPHONE HCL 1 MG/ML IJ SOLN: 0.2500 mg | INTRAMUSCULAR | Status: DC | PRN

## 2021-04-30 MED ORDER — HYDROMORPHONE HCL 1 MG/ML IJ SOLN: 1.0000 mg | INTRAMUSCULAR | Status: DC | PRN

## 2021-04-30 MED ORDER — MENTHOL 3 MG MT LOZG: 1.0000 | LOZENGE | OROMUCOSAL | Status: DC | PRN

## 2021-04-30 MED ORDER — BUPIVACAINE HCL (PF) 0.5 % IJ SOLN
INTRAMUSCULAR | Status: DC | PRN
  Administered 2021-04-30: 8 mL

## 2021-04-30 MED ORDER — ACETAMINOPHEN 500 MG PO TABS
ORAL_TABLET | ORAL | Status: AC
  Filled 2021-04-30: qty 2

## 2021-04-30 MED ORDER — KETOROLAC TROMETHAMINE 30 MG/ML IJ SOLN: 30.0000 mg | Freq: Four times a day (QID) | INTRAMUSCULAR | Status: DC

## 2021-04-30 MED ORDER — SODIUM CHLORIDE 0.9 % IR SOLN
Status: DC | PRN
  Administered 2021-04-30: 500 mL

## 2021-04-30 MED ORDER — OXYCODONE HCL 5 MG PO TABS: 5.0000 mg | ORAL_TABLET | ORAL | Status: DC | PRN

## 2021-04-30 MED ORDER — ROCURONIUM BROMIDE 10 MG/ML (PF) SYRINGE
PREFILLED_SYRINGE | INTRAVENOUS | Status: DC | PRN
  Administered 2021-04-30: 50 mg via INTRAVENOUS

## 2021-04-30 MED ORDER — LACTATED RINGERS IV SOLN: INTRAVENOUS | Status: DC

## 2021-04-30 MED ORDER — ACETAMINOPHEN 500 MG PO TABS
1000.0000 mg | ORAL_TABLET | Freq: Four times a day (QID) | ORAL | Status: DC
  Administered 2021-04-30: 1000 mg via ORAL

## 2021-04-30 MED ORDER — DEXAMETHASONE SODIUM PHOSPHATE 10 MG/ML IJ SOLN
INTRAMUSCULAR | Status: DC | PRN
  Administered 2021-04-30: 10 mg via INTRAVENOUS

## 2021-04-30 MED ORDER — PROPOFOL 10 MG/ML IV BOLUS
INTRAVENOUS | Status: AC
Start: 2021-04-30 — End: ?
  Filled 2021-04-30: qty 20

## 2021-04-30 MED ORDER — SUGAMMADEX SODIUM 200 MG/2ML IV SOLN
INTRAVENOUS | Status: DC | PRN
  Administered 2021-04-30: 200 mg via INTRAVENOUS

## 2021-04-30 MED ORDER — DEXAMETHASONE SODIUM PHOSPHATE 10 MG/ML IJ SOLN
INTRAMUSCULAR | Status: AC
  Filled 2021-04-30: qty 3

## 2021-04-30 MED ORDER — BISACODYL 10 MG RE SUPP: 10.0000 mg | Freq: Every day | RECTAL | Status: DC | PRN

## 2021-04-30 MED ORDER — MIDAZOLAM HCL 2 MG/2ML IJ SOLN
INTRAMUSCULAR | Status: DC | PRN
  Administered 2021-04-30: 2 mg via INTRAVENOUS

## 2021-04-30 MED ORDER — ALUM & MAG HYDROXIDE-SIMETH 200-200-20 MG/5ML PO SUSP: 30.0000 mL | ORAL | Status: DC | PRN

## 2021-04-30 MED ORDER — LACTATED RINGERS IV SOLN
INTRAVENOUS | Status: DC
  Administered 2021-04-30: 1000 mL via INTRAVENOUS

## 2021-04-30 MED ORDER — SODIUM CHLORIDE 0.9 % IV SOLN
INTRAVENOUS | Status: DC | PRN
  Administered 2021-04-30: 8 mL via INTRAMUSCULAR

## 2021-04-30 MED ORDER — GABAPENTIN 300 MG PO CAPS
300.0000 mg | ORAL_CAPSULE | Freq: Three times a day (TID) | ORAL | Status: DC
  Administered 2021-04-30: 300 mg via ORAL

## 2021-04-30 MED ORDER — GABAPENTIN 300 MG PO CAPS
ORAL_CAPSULE | ORAL | Status: AC
  Filled 2021-04-30: qty 1

## 2021-04-30 MED ORDER — ONDANSETRON HCL 4 MG/2ML IJ SOLN
INTRAMUSCULAR | Status: AC
  Filled 2021-04-30: qty 4

## 2021-04-30 MED ORDER — MEPERIDINE HCL 25 MG/ML IJ SOLN: 6.2500 mg | INTRAMUSCULAR | Status: DC | PRN

## 2021-04-30 MED ORDER — IBUPROFEN 200 MG PO TABS: 600.0000 mg | ORAL_TABLET | Freq: Four times a day (QID) | ORAL | Status: DC

## 2021-04-30 MED ORDER — ONDANSETRON HCL 4 MG PO TABS: 4.0000 mg | ORAL_TABLET | Freq: Four times a day (QID) | ORAL | 0 refills | Status: AC | PRN

## 2021-04-30 MED ORDER — SCOPOLAMINE 1 MG/3DAYS TD PT72
MEDICATED_PATCH | TRANSDERMAL | Status: AC
  Filled 2021-04-30: qty 1

## 2021-04-30 MED ORDER — PROPOFOL 10 MG/ML IV BOLUS
INTRAVENOUS | Status: DC | PRN
  Administered 2021-04-30: 150 mg via INTRAVENOUS

## 2021-04-30 MED ORDER — DEXTROSE-NACL 5-0.45 % IV SOLN: INTRAVENOUS | Status: DC

## 2021-04-30 MED ORDER — LIDOCAINE 2% (20 MG/ML) 5 ML SYRINGE
INTRAMUSCULAR | Status: DC | PRN
  Administered 2021-04-30: 80 mg via INTRAVENOUS

## 2021-04-30 MED ORDER — GABAPENTIN 300 MG PO CAPS: 300.0000 mg | ORAL_CAPSULE | Freq: Three times a day (TID) | ORAL | 0 refills | Status: AC

## 2021-04-30 MED ORDER — KETOROLAC TROMETHAMINE 30 MG/ML IJ SOLN
INTRAMUSCULAR | Status: DC | PRN
  Administered 2021-04-30: 30 mg via INTRAVENOUS

## 2021-04-30 MED ORDER — ONDANSETRON HCL 4 MG/2ML IJ SOLN
INTRAMUSCULAR | Status: DC | PRN
Start: 2021-04-30 — End: 2021-04-30
  Administered 2021-04-30: 4 mg via INTRAVENOUS

## 2021-04-30 MED ORDER — SCOPOLAMINE 1 MG/3DAYS TD PT72
1.0000 | MEDICATED_PATCH | TRANSDERMAL | Status: DC
  Administered 2021-04-30: 1.5 mg via TRANSDERMAL

## 2021-04-30 SURGICAL SUPPLY — 27 items
BLADE SURG 15 STRL LF DISP TIS (BLADE) IMPLANT
BLADE SURG 15 STRL SS (BLADE)
CATH ROBINSON RED A/P 16FR (CATHETERS) ×4 IMPLANT
CNTNR URN SCR LID CUP LEK RST (MISCELLANEOUS) IMPLANT
CONT SPEC 4OZ STRL OR WHT (MISCELLANEOUS) ×3
DECANTER SPIKE VIAL GLASS SM (MISCELLANEOUS) IMPLANT
GAUZE 4X4 16PLY ~~LOC~~+RFID DBL (SPONGE) ×6 IMPLANT
GLOVE SRG 8 PF TXTR STRL LF DI (GLOVE) ×3 IMPLANT
GLOVE SURG ORTHO LTX SZ7.5 (GLOVE) ×4 IMPLANT
GLOVE SURG UNDER POLY LF SZ8 (GLOVE) ×3
GOWN STRL REUS W/TWL XL LVL3 (GOWN DISPOSABLE) ×4 IMPLANT
KIT TURNOVER CYSTO (KITS) ×4 IMPLANT
NS IRRIG 500ML POUR BTL (IV SOLUTION) ×4 IMPLANT
PACK VAGINAL WOMENS (CUSTOM PROCEDURE TRAY) ×4 IMPLANT
PAD OB MATERNITY 4.3X12.25 (PERSONAL CARE ITEMS) ×4 IMPLANT
SET IRRIG Y TYPE TUR BLADDER L (SET/KITS/TRAYS/PACK) ×1 IMPLANT
SHEARS FOC LG CVD HARMONIC 17C (MISCELLANEOUS) ×4 IMPLANT
SUT CHROMIC 1 TIES 18 (SUTURE) IMPLANT
SUT CHROMIC 1MO 4 18 CR8 (SUTURE) ×4 IMPLANT
SUT SILK 2 0 SH (SUTURE) ×4 IMPLANT
SUT VIC AB 2-0 CT1 (SUTURE) IMPLANT
SUT VIC AB 2-0 CT1 27 (SUTURE) ×3
SUT VIC AB 2-0 CT1 TAPERPNT 27 (SUTURE) ×3 IMPLANT
SUT VIC AB 3-0 SH 27 (SUTURE)
SUT VIC AB 3-0 SH 27X BRD (SUTURE) IMPLANT
TOWEL OR 17X26 10 PK STRL BLUE (TOWEL DISPOSABLE) ×4 IMPLANT
TRAY FOLEY W/BAG SLVR 14FR LF (SET/KITS/TRAYS/PACK) IMPLANT

## 2021-04-30 NOTE — Interval H&P Note (Signed)
History and Physical Interval Note: ? ?04/30/2021 ?8:14 AM ? ?Shannon Dean  has presented today for surgery, with the diagnosis of cervical cancer.  The various methods of treatment have been discussed with the patient and family. After consideration of risks, benefits and other options for treatment, the patient has consented to  Procedure(s): ?HYSTERECTOMY VAGINAL, POSSIBLE BILATERAL SALPINGECTOMY (Bilateral) ?CYSTOSCOPY (N/A) as a surgical intervention.  The patient's history has been reviewed, patient examined, no change in status, stable for surgery.  I have reviewed the patient's chart and labs.  Questions were answered to the patient's satisfaction.   ? ? ?Blane Ohara Kristan Brummitt ? ? ?

## 2021-04-30 NOTE — Anesthesia Procedure Notes (Signed)
Procedure Name: Intubation ?Date/Time: 04/30/2021 8:32 AM ?Performed by: Clearnce Sorrel, CRNA ?Pre-anesthesia Checklist: Patient identified, Emergency Drugs available, Suction available and Patient being monitored ?Patient Re-evaluated:Patient Re-evaluated prior to induction ?Oxygen Delivery Method: Circle System Utilized ?Preoxygenation: Pre-oxygenation with 100% oxygen ?Induction Type: IV induction ?Ventilation: Mask ventilation without difficulty ?Laryngoscope Size: Mac and 3 ?Grade View: Grade I ?Tube type: Oral ?Tube size: 7.0 mm ?Number of attempts: 1 ?Airway Equipment and Method: Stylet and Oral airway ?Placement Confirmation: ETT inserted through vocal cords under direct vision, positive ETCO2 and breath sounds checked- equal and bilateral ?Secured at: 22 cm ?Tube secured with: Tape ?Dental Injury: Teeth and Oropharynx as per pre-operative assessment  ? ? ? ? ?

## 2021-04-30 NOTE — Op Note (Signed)
Preoperative diagnosis: Stage IA1 SCCA cervix ?Postop diagnosis: Same ?Procedure: Total vaginal hysterectomy, bilateral salpingectomy and cystoscopy ?Surgeon: Cheri Fowler M.D. ?Assistant: Crawford Givens, D.O. ?Anesthesia: Gen. ?Findings: Patient had a slightly enlarged, irregular uterus with at least one anterior myoma, normal tubes and ovaries. Via cystoscopy bladder was normal and both ureters were patent.  Assistance from Dr. Terri Piedra was necessary throughout the case to help expose pedicles and control bleeding ?Estimated blood loss: 100 cc ?Specimens: Uterus and tubes for routine pathology ?Complications: None ?  ?Procedure in detail: ?The patient was taken to the operating room and placed in the dorsosupine position. General anesthesia was induced and she was placed in mobile stirrups. Legs were elevated in the stirrups. Perineum and vagina were then prepped and draped in the usual sterile fashion, bladder drained with red Robinson catheter. A Graves speculum was inserted in the vagina and the cervix was grasped with Ardis Hughs tenaculum. Dilute Pitressin with Marcaine was then instilled at the cervicovaginal junction which was then incised circumferentially with electrocautery. Sharp dissection was then used to further free the vagina from the cervix. Anterior peritoneum was identified and entered sharply. A Deaver retractor was used to retract the bladder anteriorly. Posterior cul-de-sac was identified and entered sharply. A Bonnano speculum was placed into the posterior cul-de-sac. Uterosacral ligaments were clamped transected and ligated with #1 chromic and tagged for later use. The remaining pedicles were then taken down with harmonic scalpel.  When just the tubes were still attached, the uterus was delivered posteriorly.  Due to the myoma, I was initially only able to identify and trace the right fallopian tube.  Harmonic scalpel was the used to free the tube and any remaining attachments of the the uterus,  the uterus and right tube were removed. Left fallopian tube was then identified, traced to it's end, removed with Harmonic scalpel.  Ovaries were inspected and found to be normal. A small amount of bleeding was controlled with the Harmonic scalpel.  The uterosacral ligaments were then plicated in the midline with 2-0 silk after the Bonnano speculum was removed and a shorter vaginal speculum was placed. The previously tagged uterosacral pedicles were also tied in the midline. No significant bleeding was identified. The vagina was then closed in a vertical fashion with running locking 2-0 Vicryl with adequate closure and adequate hemostasis. ?  ?Attention was turned to cystoscopy.  A 70? cystoscope was inserted and 200 cc of fluid was instilled. The bladder appeared normal. Both ureteral orifices were easily identified and urine was seen to flow freely from each orifice. The cystoscope was removed after the bladder was drained. The patient was taken down from stirrups. She was awakened in the operating room and taken to the recovery room in stable condition after tolerating the procedure well. Counts were correct, she had PAS hose on throughout the procedure, she received Ancef preop.  ?

## 2021-04-30 NOTE — Progress Notes (Signed)
Post-op check, TVH ?Doing ok, minimal pain, some nausea with emesis x 1, feels a bit dizzy but able to ambulate, has voided, no bleeding.  Feels like she can go home ?Afeb, VSS ?Abd- soft, NT ? ?Doing well, will d/c home if continues to ambulate and tol PO ?

## 2021-04-30 NOTE — Discharge Instructions (Signed)
Routine instructions for vaginal hysterectomy ?

## 2021-04-30 NOTE — Transfer of Care (Signed)
Immediate Anesthesia Transfer of Care Note ? ?Patient: Shannon Dean ? ?Procedure(s) Performed: HYSTERECTOMY VAGINAL, POSSIBLE BILATERAL SALPINGECTOMY (Bilateral: Vagina ) ?CYSTOSCOPY (Bladder) ? ?Patient Location: PACU ? ?Anesthesia Type:General ? ?Level of Consciousness: drowsy and patient cooperative ? ?Airway & Oxygen Therapy: Patient Spontanous Breathing and Patient connected to nasal cannula oxygen ? ?Post-op Assessment: Report given to RN and Post -op Vital signs reviewed and stable ? ?Post vital signs: Reviewed and stable ? ?Last Vitals:  ?Vitals Value Taken Time  ?BP 119/62 04/30/21 0956  ?Temp    ?Pulse 88 04/30/21 0958  ?Resp 13 04/30/21 0958  ?SpO2 99 % 04/30/21 0958  ?Vitals shown include unvalidated device data. ? ?Last Pain:  ?Vitals:  ? 04/30/21 0712  ?TempSrc: Oral  ?PainSc: 0-No pain  ?   ? ?Patients Stated Pain Goal: 8 (04/30/21 7741) ? ?Complications: No notable events documented. ?

## 2021-04-30 NOTE — Anesthesia Postprocedure Evaluation (Signed)
Anesthesia Post Note ? ?Patient: Shannon Dean ? ?Procedure(s) Performed: HYSTERECTOMY VAGINAL, POSSIBLE BILATERAL SALPINGECTOMY (Bilateral: Vagina ) ?CYSTOSCOPY (Bladder) ? ?  ? ?Patient location during evaluation: PACU ?Anesthesia Type: General ?Level of consciousness: sedated and patient cooperative ?Pain management: pain level controlled ?Vital Signs Assessment: post-procedure vital signs reviewed and stable ?Respiratory status: spontaneous breathing ?Cardiovascular status: stable ?Anesthetic complications: no ? ? ?No notable events documented. ? ?Last Vitals:  ?Vitals:  ? 04/30/21 1608 04/30/21 1808  ?BP: (!) 98/59 101/62  ?Pulse: 61 65  ?Resp: 16 14  ?Temp: 36.7 ?C 36.4 ?C  ?SpO2: 97% 99%  ?  ?Last Pain:  ?Vitals:  ? 04/30/21 1808  ?TempSrc: Oral  ?PainSc: 0-No pain  ? ? ?  ?  ?  ?  ?  ?  ? ?Nolon Nations ? ? ? ? ?

## 2021-05-01 ENCOUNTER — Encounter (HOSPITAL_BASED_OUTPATIENT_CLINIC_OR_DEPARTMENT_OTHER): Payer: Self-pay | Admitting: Obstetrics and Gynecology

## 2021-05-01 LAB — SURGICAL PATHOLOGY

## 2022-01-06 ENCOUNTER — Other Ambulatory Visit: Payer: Self-pay | Admitting: Obstetrics and Gynecology

## 2022-01-06 ENCOUNTER — Ambulatory Visit
Admission: RE | Admit: 2022-01-06 | Discharge: 2022-01-06 | Disposition: A | Discharge status: Home / Self Care | Payer: BC Managed Care – PPO | Source: Ambulatory Visit | Attending: Obstetrics and Gynecology | Admitting: Obstetrics and Gynecology

## 2022-01-06 DIAGNOSIS — M545 Low back pain, unspecified: Secondary | ICD-10-CM

## 2022-11-09 ENCOUNTER — Ambulatory Visit
Admission: EM | Admit: 2022-11-09 | Discharge: 2022-11-09 | Disposition: A | Discharge status: Home / Self Care | Payer: BC Managed Care – PPO

## 2022-11-09 DIAGNOSIS — H6122 Impacted cerumen, left ear: Secondary | ICD-10-CM

## 2022-11-09 DIAGNOSIS — H8110 Benign paroxysmal vertigo, unspecified ear: Secondary | ICD-10-CM | POA: Diagnosis not present

## 2022-11-09 MED ORDER — MECLIZINE HCL 12.5 MG PO TABS: 12.5000 mg | ORAL_TABLET | Freq: Three times a day (TID) | ORAL | 0 refills | Status: AC | PRN

## 2022-11-09 NOTE — ED Triage Notes (Signed)
"  I was having a right ear problem it seems to be stopped up since Wednesday night, I did some ear drops (otc) & then dizziness started Thursday afternoon". Some nausea. No vomiting. No chest pain. No sob. No fever. No cough. No runny nose.

## 2022-11-09 NOTE — Discharge Instructions (Signed)
Can take meclizine as needed for dizziness.  Follow up with your Primary Care Physician If you develop new or worsening symptoms return here for evaluation or follow up in the Emergency Department.

## 2022-11-09 NOTE — ED Provider Notes (Signed)
[facility]    CSN: [id number] Arrival date & time: 11/09/22  4098      History   Chief Complaint Chief Complaint  Patient presents with   Ear Problem    With dizziness    HPI Shannon Dean is a 47 y.o. female.   Patient complains of left ear discomfort, reports it feels like it stopped.  She reports tries over-the-counter drops with no improvement.  Reports Thursday she started experiencing some dizziness.  Reports this only occurs when she stands up or changes positions.  Denies fever, chills, nausea, vomiting, congestion, rhinorrhea.  Denies palpitations or chest pain.    Past Medical History:  Diagnosis Date   Cancer (HCC) 2023   cervical   Migraines    PONV (postoperative nausea and vomiting)    Tendon adhesions 05/2016   left small finger - adhesions vs. rupture    Patient Active Problem List   Diagnosis Date Noted   Cervical cancer, FIGO stage IA1 (HCC) 04/30/2021   S/P vaginal hysterectomy 04/30/2021   Pain of left heel 03/21/2020   Decreased range of motion of finger 06/01/2016    Past Surgical History:  Procedure Laterality Date   CERVICAL BIOPSY  01/02/2021   CYSTOSCOPY N/A 04/30/2021   Procedure: CYSTOSCOPY;  Surgeon: Lavina Hamman, MD;  Location: [facility];  Service: Gynecology;  Laterality: N/A;   TENDON REPAIR Left 12/13/2015   Procedure: Left small finger tendon repair, flexor;  Surgeon: Betha Loa, MD;  Location: [facility];  Service: Orthopedics;  Laterality: Left;   TENOLYSIS Left 05/28/2016   Procedure: LEFT SMALL TENOLYSIS;  Surgeon: Betha Loa, MD;  Location: [facility];  Service: Orthopedics;  Laterality: Left;   TUBAL LIGATION  2014   Essure tubal ligation   VAGINAL HYSTERECTOMY Bilateral 04/30/2021   Procedure: HYSTERECTOMY VAGINAL, POSSIBLE BILATERAL SALPINGECTOMY;  Surgeon: Lavina Hamman, MD;  Location: [facility];  Service: Gynecology;  Laterality:  Bilateral;    OB History   No obstetric history on file.      Home Medications    Prior to Admission medications   Medication Sig Start Date End Date Taking? Authorizing Provider  meclizine (ANTIVERT) 12.5 MG tablet Take 1 tablet (12.5 mg total) by mouth 3 (three) times daily as needed for dizziness. 11/09/22  Yes Ward, Tylene Fantasia, PA-C  UNKNOWN TO PATIENT OTC Ear drops   Yes [provider]  cyclobenzaprine (FLEXERIL) 10 MG tablet Take 10 mg by mouth 3 (three) times daily.    [provider]  fluconazole (DIFLUCAN) 150 MG tablet Take 150 mg by mouth once.    [provider]  gabapentin (NEURONTIN) 300 MG capsule Take 1 capsule (300 mg total) by mouth 3 (three) times daily for 2 days. 04/30/21 05/02/21  Meisinger, Tawanna Cooler, MD  ibuprofen (ADVIL) 600 MG tablet Take 1 tablet (600 mg total) by mouth every 6 (six) hours. 05/01/21   Meisinger, Tawanna Cooler, MD  ibuprofen (ADVIL) 600 MG tablet Take 1 tablet by mouth every 6 (six) hours.    [provider]  meloxicam (MOBIC) 15 MG tablet Take 1 tablet by mouth daily.    [provider]  metroNIDAZOLE (METROGEL) 0.75 % vaginal gel Place 1 Applicatorful vaginally as directed.    [provider]  ondansetron (ZOFRAN) 4 MG tablet Take 1 tablet (4 mg total) by mouth every 6 (six) hours as needed for nausea. 04/30/21   Meisinger, Tawanna Cooler, MD  oxyCODONE (OXY IR/ROXICODONE) 5  MG immediate release tablet Take 1 tablet (5 mg total) by mouth every 4 (four) hours as needed for severe pain. 04/30/21   Meisinger, Tawanna Cooler, MD    Family History Family History  Problem Relation Age of Onset   Hypertension Mother     Social History Social History   Tobacco Use   Smoking status: Never   Smokeless tobacco: Never  Vaping Use   Vaping status: Never Used  Substance Use Topics   Alcohol use: No    Alcohol/week: 0.0 standard drinks of alcohol   Drug use: No     Allergies   Patient has no known allergies.   Review of  Systems Review of Systems  Constitutional:  Negative for chills and fever.  HENT:  Negative for ear pain and sore throat.   Eyes:  Positive for pain. Negative for visual disturbance.  Respiratory:  Negative for cough and shortness of breath.   Cardiovascular:  Negative for chest pain and palpitations.  Gastrointestinal:  Negative for abdominal pain and vomiting.  Genitourinary:  Negative for dysuria and hematuria.  Musculoskeletal:  Negative for arthralgias and back pain.  Skin:  Negative for color change and rash.  Neurological:  Positive for dizziness. Negative for seizures and syncope.  All other systems reviewed and are negative.    Physical Exam Triage Vital Signs ED Triage Vitals  Encounter Vitals Group     BP 11/09/22 0953 118/73     Systolic BP Percentile --      Diastolic BP Percentile --      Pulse Rate 11/09/22 0953 70     Resp 11/09/22 0953 18     Temp 11/09/22 0953 98.5 F (36.9 C)     Temp Source 11/09/22 0953 Oral     SpO2 11/09/22 0953 97 %     Weight 11/09/22 0952 165 lb (74.8 kg)     Height 11/09/22 0952 5' (1.524 m)     Head Circumference --      Peak Flow --      Pain Score 11/09/22 0950 0     Pain Loc --      Pain Education --      Exclude from Growth Chart --    No data found.  Updated Vital Signs BP 118/73 (BP Location: Left Arm)   Pulse 70   Temp 98.5 F (36.9 C) (Oral)   Resp 18   Ht 5' (1.524 m)   Wt 165 lb (74.8 kg)   LMP 04/07/2021   SpO2 97%   BMI 32.22 kg/m   Visual Acuity Right Eye Distance:   Left Eye Distance:   Bilateral Distance:    Right Eye Near:   Left Eye Near:    Bilateral Near:     Physical Exam Vitals and nursing note reviewed.  Constitutional:      General: She is not in acute distress.    Appearance: She is well-developed.  HENT:     Head: Normocephalic and atraumatic.     Left Ear: There is impacted cerumen.  Eyes:     Conjunctiva/sclera: Conjunctivae normal.  Cardiovascular:     Rate and Rhythm:  Normal rate and regular rhythm.     Heart sounds: No murmur heard. Pulmonary:     Effort: Pulmonary effort is normal. No respiratory distress.     Breath sounds: Normal breath sounds.  Abdominal:     Palpations: Abdomen is soft.     Tenderness: There is no abdominal tenderness.  Musculoskeletal:  General: No swelling.     Cervical back: Neck supple.  Skin:    General: Skin is warm and dry.     Capillary Refill: Capillary refill takes less than 2 seconds.  Neurological:     Mental Status: She is alert.  Psychiatric:        Mood and Affect: Mood normal.      UC Treatments / Results  Labs (all labs ordered are listed, but only abnormal results are displayed) Labs Reviewed - No data to display  EKG   Radiology No results found.  Procedures Procedures (including critical care time)  Medications Ordered in UC Medications - No data to display  Initial Impression / Assessment and Plan / UC Course  I have reviewed the triage vital signs and the nursing notes.  Pertinent labs & imaging results that were available during my care of the patient were reviewed by me and considered in my medical decision making (see chart for details).     Left cerumen impaction likely contributing to vertigo-like type symptoms.  Ear lavage in clinic today with removal of cerumen, TM normal after lavage.  Normal neuroexam.  Meclizine prescribed. ED precautions given.  Final Clinical Impressions(s) / UC Diagnoses   Final diagnoses:  Impacted cerumen of left ear  Benign paroxysmal positional vertigo, unspecified laterality     Discharge Instructions      Can take meclizine as needed for dizziness.  Follow up with your Primary Care Physician If you develop new or worsening symptoms return here for evaluation or follow up in the Emergency Department.      ED Prescriptions     Medication Sig Dispense Auth. Provider   meclizine (ANTIVERT) 12.5 MG tablet Take 1 tablet (12.5 mg  total) by mouth 3 (three) times daily as needed for dizziness. 30 tablet Ward, Tylene Fantasia, PA-C      PDMP not reviewed this encounter.   Ward, Tylene Fantasia, PA-C 11/09/22 1041

## 2023-05-21 ENCOUNTER — Ambulatory Visit: Admission: EM | Admit: 2023-05-21 | Discharge: 2023-05-21 | Disposition: A | Discharge status: Home / Self Care

## 2023-05-21 DIAGNOSIS — J069 Acute upper respiratory infection, unspecified: Secondary | ICD-10-CM | POA: Diagnosis not present

## 2023-05-21 LAB — POC COVID19/FLU A&B COMBO
Covid Antigen, POC: NEGATIVE
Influenza A Antigen, POC: NEGATIVE
Influenza B Antigen, POC: NEGATIVE

## 2023-05-21 MED ORDER — BENZONATATE 100 MG PO CAPS: 100.0000 mg | ORAL_CAPSULE | Freq: Three times a day (TID) | ORAL | 0 refills | Status: AC | PRN

## 2023-05-21 MED ORDER — FLUTICASONE PROPIONATE 50 MCG/ACT NA SUSP: 1.0000 | Freq: Every day | NASAL | 0 refills | Status: AC

## 2023-05-21 NOTE — ED Triage Notes (Signed)
 Pt presents with URI sxs x 4 days. Pt denies emesis and diarrhea.

## 2023-05-21 NOTE — ED Provider Notes (Signed)
 [facility]    CSN: [id number] Arrival date & time: 05/21/23  0946      History   Chief Complaint Chief Complaint  Patient presents with   URI   Facial Pain    HPI Shannon Dean is a 48 y.o. female.   Patient presents with 4-day history of nasal congestion and cough.  Denies any fever at home but does report chills.  Denies nausea, vomiting, diarrhea, sore throat.  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  She has taken Alka-Seltzer cold and flu for symptoms.  Denies history of asthma or COPD.  Patient denies that she smokes cigarettes.   URI   Past Medical History:  Diagnosis Date   Cancer (HCC) 2023   cervical   Migraines    PONV (postoperative nausea and vomiting)    Tendon adhesions 05/2016   left small finger - adhesions vs. rupture    Patient Active Problem List   Diagnosis Date Noted   Cervical cancer, FIGO stage IA1 (HCC) 04/30/2021   S/P vaginal hysterectomy 04/30/2021   Pain of left heel 03/21/2020   Decreased range of motion of finger 06/01/2016    Past Surgical History:  Procedure Laterality Date   CERVICAL BIOPSY  01/02/2021   CYSTOSCOPY N/A 04/30/2021   Procedure: CYSTOSCOPY;  Surgeon: Lavina Hamman, MD;  Location: [facility];  Service: Gynecology;  Laterality: N/A;   TENDON REPAIR Left 12/13/2015   Procedure: Left small finger tendon repair, flexor;  Surgeon: Betha Loa, MD;  Location: [facility];  Service: Orthopedics;  Laterality: Left;   TENOLYSIS Left 05/28/2016   Procedure: LEFT SMALL TENOLYSIS;  Surgeon: Betha Loa, MD;  Location: [facility];  Service: Orthopedics;  Laterality: Left;   TUBAL LIGATION  2014   Essure tubal ligation   VAGINAL HYSTERECTOMY Bilateral 04/30/2021   Procedure: HYSTERECTOMY VAGINAL, POSSIBLE BILATERAL SALPINGECTOMY;  Surgeon: Lavina Hamman, MD;  Location: [facility];  Service: Gynecology;  Laterality: Bilateral;    OB History   No obstetric history on file.      Home Medications    Prior to Admission medications   Medication Sig Start Date End Date Taking? Authorizing Provider  benzonatate (TESSALON) 100 MG capsule Take 1 capsule (100 mg total) by mouth every 8 (eight) hours as needed for cough. 05/21/23  Yes Shalae Belmonte, Rolly Salter E, FNP  fluticasone (FLONASE) 50 MCG/ACT nasal spray Place 1 spray into both nostrils daily. 05/21/23  Yes Samella Lucchetti, Rolly Salter E, FNP  cyclobenzaprine (FLEXERIL) 10 MG tablet Take 10 mg by mouth 3 (three) times daily.    [provider]  fluconazole (DIFLUCAN) 150 MG tablet Take 150 mg by mouth once.    [provider]  gabapentin (NEURONTIN) 300 MG capsule Take 1 capsule (300 mg total) by mouth 3 (three) times daily for 2 days. 04/30/21 05/02/21  Meisinger, Tawanna Cooler, MD  ibuprofen (ADVIL) 600 MG tablet Take 1 tablet (600 mg total) by mouth every 6 (six) hours. 05/01/21   Meisinger, Tawanna Cooler, MD  ibuprofen (ADVIL) 600 MG tablet Take 1 tablet by mouth every 6 (six) hours.    [provider]  meclizine (ANTIVERT) 12.5 MG tablet Take 1 tablet (12.5 mg total) by mouth 3 (three) times daily as needed for dizziness. 11/09/22   Ward, Tylene Fantasia, PA-C  meloxicam (MOBIC) 15 MG tablet Take 1 tablet by mouth daily.  [provider]  metroNIDAZOLE (METROGEL) 0.75 % vaginal gel Place 1 Applicatorful vaginally as directed.    [provider]  ondansetron (ZOFRAN) 4 MG tablet Take 1 tablet (4 mg total) by mouth every 6 (six) hours as needed for nausea. 04/30/21   Meisinger, Tawanna Cooler, MD  oxyCODONE (OXY IR/ROXICODONE) 5 MG immediate release tablet Take 1 tablet (5 mg total) by mouth every 4 (four) hours as needed for severe pain. 04/30/21   Meisinger, Todd, MD  penicillin v potassium (VEETID) 500 MG tablet TAKE 1 TABLET BY MOUTH EVERY 6 HOURS FOR 7 DAYS    [provider]  predniSONE (DELTASONE) 10 MG tablet TAKE 1  DOSE PK BY ORAL ROUTE.    [provider]  UNKNOWN TO PATIENT OTC Ear drops    [provider]    Family History Family History  Problem Relation Age of Onset   Hypertension Mother     Social History Social History   Tobacco Use   Smoking status: Never   Smokeless tobacco: Never  Vaping Use   Vaping status: Never Used  Substance Use Topics   Alcohol use: No    Alcohol/week: 0.0 standard drinks of alcohol   Drug use: No     Allergies   Patient has no known allergies.   Review of Systems Review of Systems Per HPI  Physical Exam Triage Vital Signs ED Triage Vitals  Encounter Vitals Group     BP 05/21/23 1005 116/75     Systolic BP Percentile --      Diastolic BP Percentile --      Pulse Rate 05/21/23 1005 78     Resp 05/21/23 1005 18     Temp 05/21/23 1005 99 F (37.2 C)     Temp Source 05/21/23 1005 Oral     SpO2 05/21/23 1005 98 %     Weight --      Height --      Head Circumference --      Peak Flow --      Pain Score 05/21/23 1004 0     Pain Loc --      Pain Education --      Exclude from Growth Chart --    No data found.  Updated Vital Signs BP 116/75 (BP Location: Left Arm)   Pulse 78   Temp 99 F (37.2 C) (Oral)   Resp 18   LMP 04/07/2021   SpO2 98%   Visual Acuity Right Eye Distance:   Left Eye Distance:   Bilateral Distance:    Right Eye Near:   Left Eye Near:    Bilateral Near:     Physical Exam Constitutional:      General: She is not in acute distress.    Appearance: Normal appearance. She is not toxic-appearing or diaphoretic.  HENT:     Head: Normocephalic and atraumatic.     Right Ear: Ear canal normal. A middle ear effusion is present. Tympanic membrane is not perforated, erythematous or bulging.     Left Ear: Ear canal normal. A middle ear effusion is present. Tympanic membrane is not perforated, erythematous or bulging.     Nose: Congestion present.     Mouth/Throat:     Mouth: Mucous membranes are  moist.     Pharynx: Posterior oropharyngeal erythema present.  Eyes:     Extraocular Movements: Extraocular movements intact.     Conjunctiva/sclera: Conjunctivae normal.     Pupils: Pupils are equal, round, and  reactive to light.  Cardiovascular:     Rate and Rhythm: Normal rate and regular rhythm.     Pulses: Normal pulses.     Heart sounds: Normal heart sounds.  Pulmonary:     Effort: Pulmonary effort is normal. No respiratory distress.     Breath sounds: Normal breath sounds. No stridor. No wheezing, rhonchi or rales.  Musculoskeletal:        General: Normal range of motion.     Cervical back: Normal range of motion.  Skin:    General: Skin is warm and dry.  Neurological:     General: No focal deficit present.     Mental Status: She is alert and oriented to person, place, and time. Mental status is at baseline.  Psychiatric:        Mood and Affect: Mood normal.        Behavior: Behavior normal.      UC Treatments / Results  Labs (all labs ordered are listed, but only abnormal results are displayed) Labs Reviewed  POC COVID19/FLU A&B COMBO - Normal    EKG   Radiology No results found.  Procedures Procedures (including critical care time)  Medications Ordered in UC Medications - No data to display  Initial Impression / Assessment and Plan / UC Course  I have reviewed the triage vital signs and the nursing notes.  Pertinent labs & imaging results that were available during my care of the patient were reviewed by me and considered in my medical decision making (see chart for details).     Patient presents with symptoms likely from a viral upper respiratory infection.  Do not suspect underlying cardiopulmonary process. Symptoms seem unlikely related to ACS, CHF or COPD exacerbations, pneumonia, pneumothorax. Patient is nontoxic appearing and not in need of emergent medical intervention. Covid and flu negative.   Recommended symptom control with medications,  supportive care, fluids, rest.   Return if symptoms fail to improve in 1-2 weeks or you develop shortness of breath, chest pain, severe headache. Patient states understanding and is agreeable.  Discharged with PCP followup.  Final Clinical Impressions(s) / UC Diagnoses   Final diagnoses:  Viral upper respiratory tract infection with cough     Discharge Instructions      COVID and flu test negative.  Suspect that you have a viral illness that should run its course as we discussed.  2 medications have been prescribed for you.  Ensure adequate water intake and rest.  Follow-up if any symptoms persist or worsen.    ED Prescriptions     Medication Sig Dispense Auth. Provider   benzonatate (TESSALON) 100 MG capsule Take 1 capsule (100 mg total) by mouth every 8 (eight) hours as needed for cough. 21 capsule [place], [place], [place]   fluticasone [facility]) 50 MCG/ACT nasal spray Place 1 spray into both nostrils daily. 16 g Gustavus Bryant, [place]      PDMP not reviewed this encounter.   Gustavus Bryant, [place] 05/21/23 1044

## 2023-05-21 NOTE — Discharge Instructions (Signed)
 COVID and flu test negative.  Suspect that you have a viral illness that should run its course as we discussed.  2 medications have been prescribed for you.  Ensure adequate water intake and rest.  Follow-up if any symptoms persist or worsen.

## 2023-09-03 ENCOUNTER — Encounter: Payer: Self-pay | Admitting: Advanced Practice Midwife
